# Patient Record
Sex: Female | Born: 1960 | Race: White | Hispanic: No | Marital: Married | Smoking: Never smoker
Health system: Southern US, Community
[De-identification: ages and names within clinical notes are randomized; demographics above are authoritative.]

## PROBLEM LIST (undated history)

## (undated) DIAGNOSIS — K219 Gastro-esophageal reflux disease without esophagitis: Secondary | ICD-10-CM

## (undated) DIAGNOSIS — I10 Essential (primary) hypertension: Secondary | ICD-10-CM

## (undated) DIAGNOSIS — R197 Diarrhea, unspecified: Secondary | ICD-10-CM

## (undated) DIAGNOSIS — F329 Major depressive disorder, single episode, unspecified: Secondary | ICD-10-CM

## (undated) DIAGNOSIS — F32A Depression, unspecified: Secondary | ICD-10-CM

## (undated) DIAGNOSIS — B009 Herpesviral infection, unspecified: Secondary | ICD-10-CM

## (undated) HISTORY — PX: ENDOMETRIAL ABLATION: SHX621

## (undated) HISTORY — DX: Herpesviral infection, unspecified: B00.9

## (undated) HISTORY — DX: Major depressive disorder, single episode, unspecified: F32.9

## (undated) HISTORY — PX: LAPAROSCOPIC GASTRIC BANDING: SHX1100

## (undated) HISTORY — DX: Essential (primary) hypertension: I10

## (undated) HISTORY — DX: Gastro-esophageal reflux disease without esophagitis: K21.9

## (undated) HISTORY — PX: TONSILLECTOMY: SUR1361

## (undated) HISTORY — DX: Diarrhea, unspecified: R19.7

## (undated) HISTORY — DX: Depression, unspecified: F32.A

---

## 2009-02-07 HISTORY — PX: CHOLECYSTECTOMY: SHX55

## 2014-01-06 ENCOUNTER — Telehealth: Payer: Self-pay | Admitting: Family Medicine

## 2014-01-08 NOTE — Telephone Encounter (Signed)
Patient advised we have no available appointments at this time.

## 2014-11-18 ENCOUNTER — Encounter (INDEPENDENT_AMBULATORY_CARE_PROVIDER_SITE_OTHER): Payer: Self-pay | Admitting: *Deleted

## 2014-12-17 ENCOUNTER — Ambulatory Visit (INDEPENDENT_AMBULATORY_CARE_PROVIDER_SITE_OTHER): Admitting: Internal Medicine

## 2014-12-17 ENCOUNTER — Encounter (INDEPENDENT_AMBULATORY_CARE_PROVIDER_SITE_OTHER): Payer: Self-pay | Admitting: *Deleted

## 2014-12-17 ENCOUNTER — Encounter (INDEPENDENT_AMBULATORY_CARE_PROVIDER_SITE_OTHER): Payer: Self-pay | Admitting: Internal Medicine

## 2014-12-17 ENCOUNTER — Other Ambulatory Visit (INDEPENDENT_AMBULATORY_CARE_PROVIDER_SITE_OTHER): Payer: Self-pay | Admitting: Internal Medicine

## 2014-12-17 VITALS — BP 128/92 | HR 64 | Temp 98.5°F | Ht 67.0 in | Wt 190.7 lb

## 2014-12-17 DIAGNOSIS — I10 Essential (primary) hypertension: Secondary | ICD-10-CM | POA: Insufficient documentation

## 2014-12-17 DIAGNOSIS — K219 Gastro-esophageal reflux disease without esophagitis: Secondary | ICD-10-CM

## 2014-12-17 DIAGNOSIS — F329 Major depressive disorder, single episode, unspecified: Secondary | ICD-10-CM | POA: Insufficient documentation

## 2014-12-17 DIAGNOSIS — F32A Depression, unspecified: Secondary | ICD-10-CM | POA: Insufficient documentation

## 2014-12-17 MED ORDER — PANTOPRAZOLE SODIUM 40 MG PO TBEC: 40.0000 mg | DELAYED_RELEASE_TABLET | Freq: Two times a day (BID) | ORAL | Status: DC
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
## 2014-12-17 NOTE — Patient Instructions (Signed)
EGD. The risks and benefits such as perforation, bleeding, and infection were reviewed with the patient and is agreeable. 

## 2014-12-25 ENCOUNTER — Encounter (INDEPENDENT_AMBULATORY_CARE_PROVIDER_SITE_OTHER): Payer: Self-pay | Admitting: Optometry

## 2015-01-15 ENCOUNTER — Ambulatory Visit (HOSPITAL_COMMUNITY)
Admission: RE | Admit: 2015-01-15 | Discharge: 2015-01-15 | Disposition: A | Discharge status: Home / Self Care | Source: Ambulatory Visit | Attending: Internal Medicine | Admitting: Internal Medicine

## 2015-01-15 ENCOUNTER — Encounter (HOSPITAL_COMMUNITY)
Admission: RE | Disposition: A | Discharge status: Home / Self Care | Payer: Self-pay | Source: Ambulatory Visit | Attending: Internal Medicine

## 2015-01-15 ENCOUNTER — Encounter (HOSPITAL_COMMUNITY): Payer: Self-pay | Admitting: *Deleted

## 2015-01-15 DIAGNOSIS — K219 Gastro-esophageal reflux disease without esophagitis: Secondary | ICD-10-CM

## 2015-01-15 DIAGNOSIS — I10 Essential (primary) hypertension: Secondary | ICD-10-CM | POA: Diagnosis not present

## 2015-01-15 DIAGNOSIS — F329 Major depressive disorder, single episode, unspecified: Secondary | ICD-10-CM | POA: Insufficient documentation

## 2015-01-15 DIAGNOSIS — K3189 Other diseases of stomach and duodenum: Secondary | ICD-10-CM | POA: Diagnosis not present

## 2015-01-15 DIAGNOSIS — K9509 Other complications of gastric band procedure: Secondary | ICD-10-CM | POA: Diagnosis not present

## 2015-01-15 DIAGNOSIS — K210 Gastro-esophageal reflux disease with esophagitis: Secondary | ICD-10-CM | POA: Diagnosis not present

## 2015-01-15 DIAGNOSIS — Z9884 Bariatric surgery status: Secondary | ICD-10-CM | POA: Diagnosis not present

## 2015-01-15 DIAGNOSIS — Z79899 Other long term (current) drug therapy: Secondary | ICD-10-CM | POA: Diagnosis not present

## 2015-01-15 HISTORY — PX: ESOPHAGOGASTRODUODENOSCOPY: SHX5428

## 2015-01-15 SURGERY — EGD (ESOPHAGOGASTRODUODENOSCOPY)
Anesthesia: Moderate Sedation

## 2015-01-15 MED ORDER — MIDAZOLAM HCL 5 MG/5ML IJ SOLN
INTRAMUSCULAR | Status: DC | PRN
  Administered 2015-01-15: 2 mg via INTRAVENOUS
  Administered 2015-01-15: 1 mg via INTRAVENOUS
  Administered 2015-01-15: 2 mg via INTRAVENOUS

## 2015-01-15 MED ORDER — BUTAMBEN-TETRACAINE-BENZOCAINE 2-2-14 % EX AERO
INHALATION_SPRAY | CUTANEOUS | Status: DC | PRN
  Administered 2015-01-15: 1 via TOPICAL

## 2015-01-15 MED ORDER — MEPERIDINE HCL 50 MG/ML IJ SOLN
INTRAMUSCULAR | Status: DC | PRN
  Administered 2015-01-15 (×2): 25 mg via INTRAVENOUS

## 2015-01-15 MED ORDER — STERILE WATER FOR IRRIGATION IR SOLN
Status: DC | PRN
  Administered 2015-01-15: 14:00:00

## 2015-01-15 MED ORDER — SODIUM CHLORIDE 0.9 % IV SOLN
INTRAVENOUS | Status: DC
  Administered 2015-01-15: 1000 mL via INTRAVENOUS

## 2015-01-15 MED ORDER — MEPERIDINE HCL 50 MG/ML IJ SOLN
INTRAMUSCULAR | Status: AC
  Filled 2015-01-15: qty 1

## 2015-01-15 MED ORDER — MIDAZOLAM HCL 5 MG/5ML IJ SOLN
INTRAMUSCULAR | Status: AC
  Filled 2015-01-15: qty 10

## 2015-01-15 NOTE — Discharge Instructions (Signed)
Resume usual medications. Soft diet; 6 small meals daily. No driving for 24 hours. Consultation with Dr. Ovidio Kinavid [facility] of [facility] in [place]. His office will call you. Physician will call with biopsy results.  Esophagogastroduodenoscopy, Care After Refer to this sheet in the next few weeks. These instructions provide you with information about caring for yourself after your procedure. Your health care provider may also give you more specific instructions. Your treatment has been planned according to current medical practices, but problems sometimes occur. Call your health care provider if you have any problems or questions after your procedure. WHAT TO EXPECT AFTER THE PROCEDURE After your procedure, it is typical to feel:  Soreness in your throat.  Pain with swallowing.  Sick to your stomach (nauseous).  Bloated.  Dizzy.  Fatigued. HOME CARE INSTRUCTIONS  Do not eat or drink anything until the numbing medicine (local anesthetic) has worn off and your gag reflex has returned. You will know that the local anesthetic has worn off when you can swallow comfortably.  Do not drive or operate machinery for 24 hours.  Take medicines only as directed by your health care provider. SEEK MEDICAL CARE IF:   You cannot stop coughing.  You are not urinating at all or less than usual. SEEK IMMEDIATE MEDICAL CARE IF:  You have difficulty swallowing.  You cannot eat or drink.  You have worsening throat or chest pain.  You have dizziness or lightheadedness or you faint.  You have nausea or vomiting.  You have chills.  You have a fever.  You have severe abdominal pain.  You have black, tarry, or bloody stools.   This information is not intended to replace advice given to you by your health care provider. Make sure you discuss any questions you have with your health care provider.   Document Released: 01/11/2012 Document Revised: 02/14/2014 Document Reviewed:  01/11/2012 Elsevier Interactive Patient Education [employer]2016 Elsevier Inc. .
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
## 2015-01-15 NOTE — Op Note (Signed)
EGD PROCEDURE REPORT  PATIENT:  Olivia SimonKathy Prather  MR#:  [id number] Birthdate:  Jan 20, 1961, 54 y.o., female Endoscopist:  Dr. Malissa HippoNajeeb U. Myshawn Chiriboga, MD Referred By:  Ms. Judd GaudierKeavie C.  Loman ChromanHairfield, FNP  Procedure Date: 01/15/2015  Procedure:   EGD  Indications:  Patient is 54 year old Caucasian female who presents with symptoms of refractory GERD. She is status post gastric banding 8 years ago without recent follow-up.           Informed Consent:  The risks, benefits, alternatives & imponderables which include, but are not limited to, bleeding, infection, perforation, drug reaction and potential missed lesion have been reviewed.  The potential for biopsy, lesion removal, esophageal dilation, etc. have also been discussed.  Questions have been answered.  All parties agreeable.  Please see history & physical in medical record for more information.  Medications:  Demerol 50 mg IV Versed 5 mg IV Cetacaine spray topically for oropharyngeal anesthesia  Description of procedure:  The endoscope was introduced through the mouth and advanced to the second portion of the duodenum without difficulty or limitations. The mucosal surfaces were surveyed very carefully during advancement of the scope and upon withdrawal.  Findings:  Esophagus:  Mucosa of the proximal and middle third was normal. Two erosions noted at distal esophagus proximal to GE junction. GE junction was serrated or wavy and wide open. GEJ:  35 cm Stomach:  Small proximal gastric pouch containing food debris. Luminal narrowing noted at the level of gastric banding. Scope passed across this area without too much difficulty. The gastric mucosa distal to the band was normal. Pyloric channel was patent. Angularis was unremarkable. Duodenum:  Normal bulbar and post bulbar mucosa.  Therapeutic/Diagnostic Maneuvers Performed:   Multiple biopsies taken from serrated GE junction for routine histology.  Complications:  None  EBL:  minimal  Impression: Erosive reflux esophagitis. Serrated or wavy GE junction. Biopsy taken to rule out short segment Barrett's. Food debris in proximal gastric pouch with narrowing at the level of gastric band resulting in functional obstruction. Normal examination of rest of the stomach first and second part of duodenum.  Recommendations:  Standard instructions given. Portable KUB for lap band location. Patient advised to stay on soft foods and eat 6 small meals daily. Consultation with [facility]id [facility] regarding lap band complication.  Makyia Erxleben U  01/15/2015  2:53 PM  CC: Dr. Orvilla CornwallHairfield, Keavie C & Dr. Bonnetta BarryNo ref. provider found CC: Dr. Ovidio Kinavid Newman, MD

## 2015-01-19 ENCOUNTER — Encounter (HOSPITAL_COMMUNITY): Payer: Self-pay | Admitting: Internal Medicine

## 2017-09-26 ENCOUNTER — Ambulatory Visit (INDEPENDENT_AMBULATORY_CARE_PROVIDER_SITE_OTHER): Admitting: Internal Medicine

## 2017-09-28 ENCOUNTER — Encounter (INDEPENDENT_AMBULATORY_CARE_PROVIDER_SITE_OTHER): Payer: Self-pay | Admitting: Internal Medicine

## 2017-09-28 ENCOUNTER — Ambulatory Visit (INDEPENDENT_AMBULATORY_CARE_PROVIDER_SITE_OTHER): Admitting: Internal Medicine

## 2017-09-28 VITALS — BP 140/80 | HR 68 | Temp 98.3°F | Ht 67.0 in | Wt 197.0 lb

## 2017-09-28 DIAGNOSIS — R197 Diarrhea, unspecified: Secondary | ICD-10-CM | POA: Diagnosis not present

## 2017-09-28 MED ORDER — DICYCLOMINE HCL 10 MG PO CAPS
10.0000 mg | ORAL_CAPSULE | Freq: Three times a day (TID) | ORAL | 0 refills | Status: DC
Start: 2017-09-28 — End: 2017-12-22
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
## 2017-09-28 NOTE — Patient Instructions (Addendum)
GI pathogen Rx for Dicyclomine.TID.  OV in 6 weeks

## 2017-10-11 LAB — GASTROINTESTINAL PATHOGEN PANEL PCR
C. DIFFICILE TOX A/B, PCR: NOT DETECTED
Campylobacter, PCR: NOT DETECTED
Cryptosporidium, PCR: NOT DETECTED
E COLI (ETEC) LT/ST, PCR: NOT DETECTED
E COLI (STEC) STX1/STX2, PCR: NOT DETECTED
E coli 0157, PCR: NOT DETECTED
Giardia lamblia, PCR: NOT DETECTED
Norovirus, PCR: NOT DETECTED
ROTAVIRUS, PCR: NOT DETECTED
SALMONELLA, PCR: NOT DETECTED
Shigella, PCR: NOT DETECTED

## 2017-11-09 ENCOUNTER — Encounter (INDEPENDENT_AMBULATORY_CARE_PROVIDER_SITE_OTHER): Payer: Self-pay | Admitting: Internal Medicine

## 2017-11-09 ENCOUNTER — Ambulatory Visit (INDEPENDENT_AMBULATORY_CARE_PROVIDER_SITE_OTHER): Admitting: Internal Medicine

## 2017-11-09 DIAGNOSIS — R197 Diarrhea, unspecified: Secondary | ICD-10-CM | POA: Diagnosis not present

## 2017-11-09 HISTORY — DX: Diarrhea, unspecified: R19.7

## 2017-11-09 NOTE — Patient Instructions (Signed)
OV in 1 year.  
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
## 2017-12-22 ENCOUNTER — Other Ambulatory Visit (INDEPENDENT_AMBULATORY_CARE_PROVIDER_SITE_OTHER): Payer: Self-pay | Admitting: Internal Medicine

## 2017-12-22 DIAGNOSIS — R197 Diarrhea, unspecified: Secondary | ICD-10-CM
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
## 2018-11-12 ENCOUNTER — Other Ambulatory Visit: Payer: Self-pay

## 2018-11-12 ENCOUNTER — Encounter (INDEPENDENT_AMBULATORY_CARE_PROVIDER_SITE_OTHER): Payer: Self-pay | Admitting: *Deleted

## 2018-11-12 ENCOUNTER — Ambulatory Visit (INDEPENDENT_AMBULATORY_CARE_PROVIDER_SITE_OTHER): Admitting: Nurse Practitioner

## 2018-11-12 ENCOUNTER — Telehealth (INDEPENDENT_AMBULATORY_CARE_PROVIDER_SITE_OTHER): Payer: Self-pay | Admitting: *Deleted

## 2018-11-12 ENCOUNTER — Encounter (INDEPENDENT_AMBULATORY_CARE_PROVIDER_SITE_OTHER): Payer: Self-pay | Admitting: Nurse Practitioner

## 2018-11-12 VITALS — BP 161/89 | HR 65 | Temp 98.1°F | Ht 67.0 in | Wt 195.2 lb

## 2018-11-12 DIAGNOSIS — K625 Hemorrhage of anus and rectum: Secondary | ICD-10-CM

## 2018-11-12 DIAGNOSIS — Z8379 Family history of other diseases of the digestive system: Secondary | ICD-10-CM | POA: Diagnosis not present

## 2018-11-12 DIAGNOSIS — R197 Diarrhea, unspecified: Secondary | ICD-10-CM

## 2018-11-12 MED ORDER — PEG 3350-KCL-NA BICARB-NACL 420 G PO SOLR: 4000.0000 mL | Freq: Once | ORAL | 0 refills | Status: AC

## 2018-11-12 MED ORDER — DICYCLOMINE HCL 10 MG PO CAPS: 10.0000 mg | ORAL_CAPSULE | Freq: Three times a day (TID) | ORAL | 1 refills | Status: DC | PRN

## 2018-11-12 NOTE — Telephone Encounter (Signed)
Patient needs trilyte TCS sch'd 11/11

## 2018-11-12 NOTE — Patient Instructions (Signed)
1. Complete the ordered blood tests today  2. Schedule a colonoscopy   3. Follow up with your primary doctor regarding your elevated blood pressure  4. Dicyclomine 10mg  one tab every 8 hours as needed for IBS symptoms

## 2018-11-13 ENCOUNTER — Other Ambulatory Visit (INDEPENDENT_AMBULATORY_CARE_PROVIDER_SITE_OTHER): Payer: Self-pay | Admitting: *Deleted

## 2018-11-16 LAB — CBC WITH DIFFERENTIAL/PLATELET
Absolute Monocytes: 490 cells/uL (ref 200–950)
Basophils Absolute: 28 cells/uL (ref 0–200)
Basophils Relative: 0.4 %
Eosinophils Absolute: 173 cells/uL (ref 15–500)
Eosinophils Relative: 2.5 %
HCT: 37.1 % (ref 35.0–45.0)
Hemoglobin: 12.6 g/dL (ref 11.7–15.5)
Lymphs Abs: 1656 cells/uL (ref 850–3900)
MCH: 29.7 pg (ref 27.0–33.0)
MCHC: 34 g/dL (ref 32.0–36.0)
MCV: 87.5 fL (ref 80.0–100.0)
MPV: 9.6 fL (ref 7.5–12.5)
Monocytes Relative: 7.1 %
Neutro Abs: 4554 cells/uL (ref 1500–7800)
Neutrophils Relative %: 66 %
Platelets: 205 10*3/uL (ref 140–400)
RBC: 4.24 10*6/uL (ref 3.80–5.10)
RDW: 13 % (ref 11.0–15.0)
Total Lymphocyte: 24 %
WBC: 6.9 10*3/uL (ref 3.8–10.8)

## 2018-11-16 LAB — COMPLETE METABOLIC PANEL WITH GFR
AG Ratio: 1.7 (calc) (ref 1.0–2.5)
ALT: 19 U/L (ref 6–29)
AST: 18 U/L (ref 10–35)
Albumin: 4.4 g/dL (ref 3.6–5.1)
Alkaline phosphatase (APISO): 66 U/L (ref 37–153)
BUN: 10 mg/dL (ref 7–25)
CO2: 30 mmol/L (ref 20–32)
Calcium: 9.4 mg/dL (ref 8.6–10.4)
Chloride: 102 mmol/L (ref 98–110)
Creat: 0.66 mg/dL (ref 0.50–1.05)
GFR, Est African American: 114 mL/min/{1.73_m2} (ref >=60)
GFR, Est Non African American: 99 mL/min/{1.73_m2} (ref >=60)
Globulin: 2.6 g/dL (calc) (ref 1.9–3.7)
Glucose, Bld: 90 mg/dL (ref 65–139)
Potassium: 4.2 mmol/L (ref 3.5–5.3)
Sodium: 140 mmol/L (ref 135–146)
Total Bilirubin: 0.5 mg/dL (ref 0.2–1.2)
Total Protein: 7 g/dL (ref 6.1–8.1)

## 2018-11-16 LAB — CELIAC DISEASE PANEL
(tTG) Ab, IgA: 1 U/mL
(tTG) Ab, IgG: 5 U/mL
Gliadin IgA: 3 Units
Gliadin IgG: 2 Units
Immunoglobulin A: 160 mg/dL (ref 47–310)

## 2018-11-16 LAB — C-REACTIVE PROTEIN: CRP: 7.7 mg/L (ref <8.0)

## 2018-11-16 LAB — TSH: TSH: 1.92 mIU/L (ref 0.40–4.50)

## 2018-12-11 ENCOUNTER — Other Ambulatory Visit (INDEPENDENT_AMBULATORY_CARE_PROVIDER_SITE_OTHER): Payer: Self-pay | Admitting: *Deleted

## 2018-12-17 ENCOUNTER — Other Ambulatory Visit: Payer: Self-pay

## 2018-12-17 ENCOUNTER — Other Ambulatory Visit (HOSPITAL_COMMUNITY)
Admission: RE | Admit: 2018-12-17 | Discharge: 2018-12-17 | Disposition: A | Discharge status: Home / Self Care | Source: Ambulatory Visit | Attending: Internal Medicine | Admitting: Internal Medicine

## 2018-12-17 DIAGNOSIS — Z01812 Encounter for preprocedural laboratory examination: Secondary | ICD-10-CM | POA: Insufficient documentation

## 2018-12-17 DIAGNOSIS — Z20828 Contact with and (suspected) exposure to other viral communicable diseases: Secondary | ICD-10-CM | POA: Diagnosis not present

## 2018-12-17 LAB — SARS CORONAVIRUS 2 (TAT 6-24 HRS): SARS Coronavirus 2: NEGATIVE

## 2018-12-19 ENCOUNTER — Ambulatory Visit (HOSPITAL_COMMUNITY)
Admission: RE | Admit: 2018-12-19 | Discharge: 2018-12-19 | Disposition: A | Discharge status: Home / Self Care | Attending: Internal Medicine | Admitting: Internal Medicine

## 2018-12-19 ENCOUNTER — Encounter (HOSPITAL_COMMUNITY): Payer: Self-pay | Admitting: *Deleted

## 2018-12-19 ENCOUNTER — Other Ambulatory Visit: Payer: Self-pay

## 2018-12-19 ENCOUNTER — Encounter (HOSPITAL_COMMUNITY)
Admission: RE | Disposition: A | Discharge status: Home / Self Care | Payer: Self-pay | Source: Home / Self Care | Attending: Internal Medicine

## 2018-12-19 DIAGNOSIS — Z79899 Other long term (current) drug therapy: Secondary | ICD-10-CM | POA: Diagnosis not present

## 2018-12-19 DIAGNOSIS — K529 Noninfective gastroenteritis and colitis, unspecified: Secondary | ICD-10-CM | POA: Diagnosis present

## 2018-12-19 DIAGNOSIS — F329 Major depressive disorder, single episode, unspecified: Secondary | ICD-10-CM | POA: Insufficient documentation

## 2018-12-19 DIAGNOSIS — K52832 Lymphocytic colitis: Secondary | ICD-10-CM | POA: Insufficient documentation

## 2018-12-19 DIAGNOSIS — K625 Hemorrhage of anus and rectum: Secondary | ICD-10-CM

## 2018-12-19 DIAGNOSIS — B009 Herpesviral infection, unspecified: Secondary | ICD-10-CM | POA: Insufficient documentation

## 2018-12-19 DIAGNOSIS — K6289 Other specified diseases of anus and rectum: Secondary | ICD-10-CM | POA: Diagnosis not present

## 2018-12-19 DIAGNOSIS — K219 Gastro-esophageal reflux disease without esophagitis: Secondary | ICD-10-CM | POA: Insufficient documentation

## 2018-12-19 DIAGNOSIS — K648 Other hemorrhoids: Secondary | ICD-10-CM | POA: Insufficient documentation

## 2018-12-19 DIAGNOSIS — I10 Essential (primary) hypertension: Secondary | ICD-10-CM | POA: Diagnosis not present

## 2018-12-19 DIAGNOSIS — Z881 Allergy status to other antibiotic agents status: Secondary | ICD-10-CM | POA: Insufficient documentation

## 2018-12-19 DIAGNOSIS — R197 Diarrhea, unspecified: Secondary | ICD-10-CM

## 2018-12-19 DIAGNOSIS — K644 Residual hemorrhoidal skin tags: Secondary | ICD-10-CM | POA: Insufficient documentation

## 2018-12-19 HISTORY — PX: COLONOSCOPY: SHX5424

## 2018-12-19 HISTORY — PX: BIOPSY: SHX5522

## 2018-12-19 SURGERY — COLONOSCOPY
Anesthesia: Moderate Sedation

## 2018-12-19 MED ORDER — SODIUM CHLORIDE 0.9 % IV SOLN
INTRAVENOUS | Status: DC
  Administered 2018-12-19: 1000 mL via INTRAVENOUS

## 2018-12-19 MED ORDER — MEPERIDINE HCL 50 MG/ML IJ SOLN
INTRAMUSCULAR | Status: DC | PRN
  Administered 2018-12-19 (×2): 25 mg

## 2018-12-19 MED ORDER — MIDAZOLAM HCL 5 MG/5ML IJ SOLN
INTRAMUSCULAR | Status: DC | PRN
  Administered 2018-12-19 (×2): 2 mg via INTRAVENOUS

## 2018-12-19 MED ORDER — STERILE WATER FOR IRRIGATION IR SOLN
Status: DC | PRN
  Administered 2018-12-19: 1.5 mL

## 2018-12-19 MED ORDER — MIDAZOLAM HCL 5 MG/5ML IJ SOLN
INTRAMUSCULAR | Status: AC
  Filled 2018-12-19: qty 10

## 2018-12-19 MED ORDER — MEPERIDINE HCL 50 MG/ML IJ SOLN
INTRAMUSCULAR | Status: AC
  Filled 2018-12-19: qty 1

## 2018-12-19 NOTE — Op Note (Signed)
[facility] Patient Name: Olivia Acevedo Procedure Date: 12/19/2018 1:14 PM MRN: [id number] Date of Birth: 07/12/1960 Attending MD: Lionel December , MD CSN: [id number] Age: 58 Admit Type: Outpatient Procedure:                Colonoscopy Indications:              Chronic diarrhea Providers:                Lionel December, MD, Sterling Big, RN, Burke Keels, Technician Referring MD:             Judd Gaudier Hairfield, NP Medicines:                Meperidine 50 mg IV, Midazolam 4 mg IV Complications:            No immediate complications. Estimated Blood Loss:     Estimated blood loss was minimal. Procedure:                Pre-Anesthesia Assessment:                           - Prior to the procedure, a History and Physical                            was performed, and patient medications and                            allergies were reviewed. The patient's tolerance of                            previous anesthesia was also reviewed. The risks                            and benefits of the procedure and the sedation                            options and risks were discussed with the patient.                            All questions were answered, and informed consent                            was obtained. Prior Anticoagulants: The patient has                            taken no previous anticoagulant or antiplatelet                            agents. ASA Grade Assessment: II - A patient with                            mild systemic disease. After reviewing the risks  and benefits, the patient was deemed in                            satisfactory condition to undergo the procedure.                           After obtaining informed consent, the colonoscope                            was passed under direct vision. Throughout the                            procedure, the patient's blood pressure, pulse, and          oxygen saturations were monitored continuously. The                            [id number]-[id number] ([id number]) scope was introduced through                            the anus and advanced to the the terminal ileum,                            with identification of the appendiceal orifice and                            IC valve. The colonoscopy was performed without                            difficulty. The patient tolerated the procedure                            well. The quality of the bowel preparation was                            good. The terminal ileum, ileocecal valve,                            appendiceal orifice, and rectum were photographed. Scope In: 1:51:50 PM Scope Out: 2:12:56 PM Scope Withdrawal Time: 0 hours 14 minutes 23 seconds  Total Procedure Duration: 0 hours 21 minutes 6 seconds  Findings:      Skin tags were found on perianal exam.      The terminal ileum appeared normal.      The colon (entire examined portion) appeared normal. Biopsies for       histology were taken with a cold forceps from the ascending colon and       sigmoid colon for evaluation of microscopic colitis. The pathology       specimen was placed into Bottle Number 1.      Internal hemorrhoids were found during retroflexion. The hemorrhoids       were small.      Anal papilla(e) were hypertrophied. Impression:               - Perianal skin tags found on perianal exam.                           -  The examined portion of the ileum was normal.                           - The entire examined colon is normal. Biopsied.                           - Internal hemorrhoids.                           - Anal papilla(e) were hypertrophied. Moderate Sedation:      Moderate (conscious) sedation was administered by the endoscopy nurse       and supervised by the endoscopist. The following parameters were       monitored: oxygen saturation, heart rate, blood pressure, CO2       capnography and response to care.  Total physician intraservice time was       26 minutes. Recommendation:           - Patient has a contact number available for                            emergencies. The signs and symptoms of potential                            delayed complications were discussed with the                            patient. Return to normal activities tomorrow.                            Written discharge instructions were provided to the                            patient.                           - Resume previous diet today.                           - Continue present medications.                           - No aspirin, ibuprofen, naproxen, or other                            non-steroidal anti-inflammatory drugs for 1 day.                           - Await pathology results.                           - Repeat colonoscopy in 10 years for screening                            purposes. Procedure Code(s):        --- Professional ---  84166, Colonoscopy, flexible; with biopsy, single                            or multiple                           99153, Moderate sedation; each additional 15                            minutes intraservice time                           G0500, Moderate sedation services provided by the                            same physician or other qualified health care                            professional performing a gastrointestinal                            endoscopic service that sedation supports,                            requiring the presence of an independent trained                            observer to assist in the monitoring of the                            patient's level of consciousness and physiological                            status; initial 15 minutes of intra-service time;                            patient age 35 years or older (additional time may                            be reported with [phone], as appropriate) Diagnosis  Code(s):        --- Professional ---                           K64.8, Other hemorrhoids                           K62.89, Other specified diseases of anus and rectum                           K64.4, Residual hemorrhoidal skin tags                           K52.9, Noninfective gastroenteritis and colitis,                            unspecified CPT copyright  2019 American Medical Association. All rights reserved. The codes documented in this report are preliminary and upon coder review may  be revised to meet current compliance requirements. Lionel DecemberNajeeb Rehman, MD Lionel DecemberNajeeb Rehman, MD 12/19/2018 2:20:01 PM This report has been signed electronically. Number of Addenda: 0

## 2018-12-19 NOTE — H&P (Signed)
Olivia Acevedo is an 58 y.o. female.   Chief Complaint: Patient is here for colonoscopy. HPI: Patient is 58 year old Caucasian female who presents with several month history of diarrhea which has gradually gotten worse.  She has 5-6 stools on most days.  On most days she can many more.  Stool is usually watery.  She does notice blood at times she feels is due to hemorrhoids or maybe when she goes to many times.  It usually is small amount of blood.  She denies nausea vomiting fever or chills.  She states she has lost 13 pounds this year.  She feels as both voluntary and involuntary. Family history significant celiac disease in her sister and niece.  Patient was screened for celiac antibody panel and was negative.  Patient reports doing much better with 10 mg of dicyclomine daily. Patient's last colonoscopy was 10 years ago and was normal.  This exam was performed when she was living in [place].  Past Medical History:  Diagnosis Date  . Depression   . Diarrhea 11/09/2017  . GERD (gastroesophageal reflux disease)   . Herpes simplex   . Hypertension     Past Surgical History:  Procedure Laterality Date  . CHOLECYSTECTOMY  2011  . ENDOMETRIAL ABLATION     2000  . ESOPHAGOGASTRODUODENOSCOPY N/A 01/15/2015   Procedure: ESOPHAGOGASTRODUODENOSCOPY (EGD);  Surgeon: Malissa Hippo, MD;  Location: [facility];  Service: Endoscopy;  Laterality: N/A;  2:10  . LAPAROSCOPIC GASTRIC BANDING     2008  . TONSILLECTOMY     1966    History reviewed. No pertinent family history. Social History:  reports that she has never smoked. She has never used smokeless tobacco. She reports current alcohol use. She reports that she does not use drugs.  Allergies:  Allergies  Allergen Reactions  . Shellfish Allergy     Hives and swelling  . Vancomycin     On fire. rash    Medications Prior to Admission  Medication Sig Dispense Refill  . dicyclomine (BENTYL) 10 MG capsule Take 1 capsule (10 mg total) by  mouth 3 (three) times daily as needed for spasms. 90 capsule 1  . hydrochlorothiazide (HYDRODIURIL) 25 MG tablet Take 25 mg by mouth daily.    Marland Kitchen lisinopril (PRINIVIL,ZESTRIL) 40 MG tablet Take 40 mg by mouth daily.    . sertraline (ZOLOFT) 100 MG tablet Take 100 mg by mouth daily.    Marland Kitchen amLODipine (NORVASC) 5 MG tablet Take 5 mg by mouth daily.    Marland Kitchen esomeprazole (NEXIUM) 20 MG capsule Take 20 mg by mouth as needed (Heart burn).     . valACYclovir (VALTREX) 500 MG tablet Take 500 mg by mouth daily as needed (Cold sore).       No results found for this or any previous visit (from the past 48 hour(s)). No results found.  ROS  Blood pressure 128/72, pulse 66, temperature 97.7 F (36.5 C), temperature source Oral, resp. rate 11, height 5\' 7"  (1.702 m), weight 85.7 kg, SpO2 100 %. Physical Exam  Constitutional: She appears well-developed and well-nourished.  HENT:  Mouth/Throat: Oropharynx is clear and moist.  Eyes: Conjunctivae are normal. No scleral icterus.  Neck: No thyromegaly present.  Cardiovascular: Normal rate, regular rhythm and normal heart sounds.  No murmur heard. Respiratory: Effort normal and breath sounds normal.  GI: Soft. She exhibits no distension and no mass. There is no abdominal tenderness.  Musculoskeletal:        General: No edema.  Lymphadenopathy:    She has no cervical adenopathy.  Neurological: She is alert.  Skin: Skin is warm and dry.     Assessment/Plan Chronic diarrhea. Diagnostic colonoscopy.  Hildred Laser, MD 12/19/2018, 1:41 PM

## 2018-12-19 NOTE — Discharge Instructions (Signed)
No aspirin or NSAIDs for 24 hours. Resume usual medications and diet as before. No driving for 24 hours. Physician will call with biopsy results.   Colonoscopy, Adult, Care After This sheet gives you information about how to care for yourself after your procedure. Your health care provider may also give you more specific instructions. If you have problems or questions, contact your health care provider.  Dr. Laural Golden:  [phone] What can I expect after the procedure? After the procedure, it is common to have:  A small amount of blood in your stool for 24 hours after the procedure.  Some gas.  Mild abdominal cramping or bloating. Follow these instructions at home: General instructions  For the first 24 hours after the procedure: ? Do not drive or use machinery. ? Do not sign important documents. ? Do not drink alcohol. ? Do your regular daily activities at a slower pace than normal. ? Eat soft, easy-to-digest foods.  Take over-the-counter or prescription medicines only as told by your health care provider. Relieving cramping and bloating   Try walking around when you have cramps or feel bloated.  Eating and drinking   Drink enough fluid to keep your urine pale yellow.  Avoid drinking alcohol for as long as instructed by your health care provider. Contact a health care provider if:  You have blood in your stool 2-3 days after the procedure. Get help right away if:  You have more than a small spotting of blood in your stool.  You pass large blood clots in your stool.  Your abdomen is swollen.  You have nausea or vomiting.  You have a fever.  You have increasing abdominal pain that is not relieved with medicine. Summary  After the procedure, it is common to have a small amount of blood in your stool. You may also have mild abdominal cramping and bloating.  For the first 24 hours after the procedure, do not drive or use machinery, sign important documents, or  drink alcohol.  Contact your health care provider if you have a lot of blood in your stool, nausea or vomiting, a fever, or increased abdominal pain. This information is not intended to replace advice given to you by your health care provider. Make sure you discuss any questions you have with your health care provider. Document Released: 09/08/2003 Document Revised: 11/16/2016 Document Reviewed: 04/07/2015 Elsevier Patient Education  2020 [employer].

## 2018-12-21 LAB — SURGICAL PATHOLOGY

## 2018-12-24 ENCOUNTER — Encounter (HOSPITAL_COMMUNITY): Payer: Self-pay | Admitting: Internal Medicine

## 2019-06-25 ENCOUNTER — Ambulatory Visit (INDEPENDENT_AMBULATORY_CARE_PROVIDER_SITE_OTHER): Admitting: Internal Medicine

## 2020-12-09 ENCOUNTER — Other Ambulatory Visit: Payer: Self-pay | Admitting: Neurosurgery

## 2020-12-09 DIAGNOSIS — S22018A Other fracture of first thoracic vertebra, initial encounter for closed fracture: Secondary | ICD-10-CM

## 2020-12-10 ENCOUNTER — Other Ambulatory Visit: Payer: Self-pay

## 2020-12-10 ENCOUNTER — Ambulatory Visit
Admission: RE | Admit: 2020-12-10 | Discharge: 2020-12-10 | Disposition: A | Discharge status: Home / Self Care | Source: Ambulatory Visit | Attending: Neurosurgery | Admitting: Neurosurgery

## 2020-12-10 DIAGNOSIS — S22018A Other fracture of first thoracic vertebra, initial encounter for closed fracture: Secondary | ICD-10-CM

## 2020-12-28 ENCOUNTER — Encounter (HOSPITAL_BASED_OUTPATIENT_CLINIC_OR_DEPARTMENT_OTHER): Payer: Self-pay

## 2020-12-28 ENCOUNTER — Emergency Department (HOSPITAL_BASED_OUTPATIENT_CLINIC_OR_DEPARTMENT_OTHER)

## 2020-12-28 ENCOUNTER — Emergency Department (HOSPITAL_BASED_OUTPATIENT_CLINIC_OR_DEPARTMENT_OTHER)
Admission: EM | Admit: 2020-12-28 | Discharge: 2020-12-28 | Disposition: A | Discharge status: Home / Self Care | Attending: Emergency Medicine | Admitting: Emergency Medicine

## 2020-12-28 ENCOUNTER — Other Ambulatory Visit: Payer: Self-pay

## 2020-12-28 DIAGNOSIS — I10 Essential (primary) hypertension: Secondary | ICD-10-CM | POA: Diagnosis not present

## 2020-12-28 DIAGNOSIS — M7632 Iliotibial band syndrome, left leg: Secondary | ICD-10-CM | POA: Insufficient documentation

## 2020-12-28 DIAGNOSIS — Z79899 Other long term (current) drug therapy: Secondary | ICD-10-CM | POA: Diagnosis not present

## 2020-12-28 DIAGNOSIS — M25562 Pain in left knee: Secondary | ICD-10-CM | POA: Diagnosis present

## 2020-12-28 MED ORDER — KETOROLAC TROMETHAMINE 60 MG/2ML IM SOLN
30.0000 mg | Freq: Once | INTRAMUSCULAR | Status: AC
  Administered 2020-12-28: 30 mg via INTRAMUSCULAR
  Filled 2020-12-28: qty 2

## 2020-12-28 MED ORDER — MELOXICAM 7.5 MG PO TABS: 7.5000 mg | ORAL_TABLET | Freq: Every day | ORAL | 0 refills | Status: AC

## 2020-12-28 NOTE — ED Provider Notes (Signed)
[facility] DEPT Provider Note   CSN: [id number] Arrival date & time: 12/28/20  [id number]     History Chief Complaint  Patient presents with   Knee Pain    left    Olivia Acevedo is a 60 y.o. female.   Knee Pain Associated symptoms: no back pain, no fatigue, no fever and no neck pain   Patient presents for 2 days of lateral left knee pain.  She has not had any associated swelling.  She has not had any recent injuries.  Pain does radiate up the lateral left side of her proximal left leg.  Pain is worsened with palpation and ambulation.  She denies any systemic symptoms.  She has not taken anything at home for analgesia.    Past Medical History:  Diagnosis Date   Depression    Diarrhea 11/09/2017   GERD (gastroesophageal reflux disease)    Herpes simplex    Hypertension     Patient Active Problem List   Diagnosis Date Noted   Rectal bleeding 11/12/2018   Family history of celiac disease 11/12/2018   Diarrhea 11/09/2017   GERD (gastroesophageal reflux disease) 12/17/2014   Depression 12/17/2014   Essential hypertension 12/17/2014    Past Surgical History:  Procedure Laterality Date   BIOPSY  12/19/2018   Procedure: BIOPSY;  Surgeon: Rogene Houston, MD;  Location: [facility];  Service: Endoscopy;;   CHOLECYSTECTOMY  2011   COLONOSCOPY N/A 12/19/2018   Procedure: COLONOSCOPY;  Surgeon: Rogene Houston, MD;  Location: [facility];  Service: Endoscopy;  Laterality: N/A;  225pm   ENDOMETRIAL ABLATION     2000   ESOPHAGOGASTRODUODENOSCOPY N/A 01/15/2015   Procedure: ESOPHAGOGASTRODUODENOSCOPY (EGD);  Surgeon: Rogene Houston, MD;  Location: [facility];  Service: Endoscopy;  Laterality: N/A;  2:10   LAPAROSCOPIC GASTRIC BANDING     2008   TONSILLECTOMY     1966     OB History   No obstetric history on file.     No family history on file.  Social History   Tobacco Use   Smoking status: Never   Smokeless tobacco: Never  Vaping  Use   Vaping Use: Never used  Substance Use Topics   Alcohol use: Yes    Alcohol/week: 0.0 standard drinks    Comment: occasionally   Drug use: No    Home Medications Prior to Admission medications   Medication Sig Start Date End Date Taking? Authorizing Provider  amLODipine (NORVASC) 5 MG tablet Take 5 mg by mouth daily.   Yes [provider]  dicyclomine (BENTYL) 10 MG capsule Take 1 capsule (10 mg total) by mouth 3 (three) times daily as needed for spasms. 11/12/18  Yes Noralyn Pick, NP  hydrochlorothiazide (HYDRODIURIL) 25 MG tablet Take 25 mg by mouth daily.   Yes [provider]  lisinopril (PRINIVIL,ZESTRIL) 40 MG tablet Take 40 mg by mouth daily.   Yes [provider]  meloxicam (MOBIC) 7.5 MG tablet Take 1 tablet (7.5 mg total) by mouth daily for 10 days. 12/28/20 01/07/21 Yes Godfrey Pick, MD  sertraline (ZOLOFT) 100 MG tablet Take 100 mg by mouth daily.   Yes [provider]  esomeprazole (NEXIUM) 20 MG capsule Take 20 mg by mouth as needed (Heart burn).  Patient not taking: Reported on 12/28/2020    [provider]  valACYclovir (VALTREX) 500 MG tablet Take 500 mg by mouth daily as needed (Cold sore).     [provider]  Allergies    Shellfish allergy and Vancomycin  Review of Systems   Review of Systems  Constitutional:  Negative for chills, fatigue and fever.  HENT:  Negative for ear pain and sore throat.   Eyes:  Negative for pain and visual disturbance.  Respiratory:  Negative for cough and shortness of breath.   Cardiovascular:  Negative for chest pain and palpitations.  Gastrointestinal:  Negative for abdominal pain and vomiting.  Genitourinary:  Negative for dysuria and hematuria.  Musculoskeletal:  Positive for arthralgias. Negative for back pain, joint swelling, myalgias and neck pain.  Skin:  Negative for color change and rash.  Neurological:  Negative for dizziness, seizures, syncope,  weakness and numbness.  All other systems reviewed and are negative.  Physical Exam Updated Vital Signs BP (!) 138/107 (BP Location: Right Arm)   Pulse 73   Temp 98.6 F (37 C) (Oral)   Resp 15   Ht 5\' 7"  (1.702 m)   Wt 83.9 kg   SpO2 100%   BMI 28.98 kg/m   Physical Exam Vitals and nursing note reviewed.  Constitutional:      General: She is not in acute distress.    Appearance: Normal appearance. She is well-developed and normal weight. She is not ill-appearing, toxic-appearing or diaphoretic.  HENT:     Head: Normocephalic and atraumatic.     Right Ear: External ear normal.     Left Ear: External ear normal.     Nose: Nose normal.  Eyes:     Conjunctiva/sclera: Conjunctivae normal.  Cardiovascular:     Rate and Rhythm: Normal rate and regular rhythm.     Heart sounds: No murmur heard. Pulmonary:     Effort: Pulmonary effort is normal. No respiratory distress.     Breath sounds: Normal breath sounds.  Abdominal:     Palpations: Abdomen is soft.     Tenderness: There is no abdominal tenderness.  Musculoskeletal:        General: Tenderness (Distal ITB) present. No swelling, deformity or signs of injury.     Cervical back: Normal range of motion and neck supple.  Skin:    General: Skin is warm and dry.     Capillary Refill: Capillary refill takes less than 2 seconds.     Coloration: Skin is not jaundiced or pale.  Neurological:     General: No focal deficit present.     Mental Status: She is alert and oriented to person, place, and time.     Cranial Nerves: No cranial nerve deficit.     Sensory: No sensory deficit.     Motor: No weakness.  Psychiatric:        Mood and Affect: Mood normal.        Behavior: Behavior normal.        Thought Content: Thought content normal.        Judgment: Judgment normal.    ED Results / Procedures / Treatments   Labs (all labs ordered are listed, but only abnormal results are displayed) Labs Reviewed - No data to  display  EKG None  Radiology DG Knee 2 Views Left  Result Date: 12/28/2020 CLINICAL DATA:  Left knee pain EXAM: LEFT KNEE - 2 VIEW COMPARISON:  None. FINDINGS: No evidence of fracture, dislocation, or joint effusion. No evidence of arthropathy or other focal bone abnormality. Soft tissues are unremarkable. IMPRESSION: Negative. Electronically Signed   By: 12/30/2020 M.D.   On: 12/28/2020 09:23    Procedures Procedures  Medications Ordered in ED Medications  ketorolac (TORADOL) injection 30 mg (30 mg Intramuscular Given 12/28/20 [id number])    ED Course  I have reviewed the triage vital signs and the nursing notes.  Pertinent labs & imaging results that were available during my care of the patient were reviewed by me and considered in my medical decision making (see chart for details).    MDM Rules/Calculators/A&P                          Healthy 60 year old female presenting for 2 days of lateral left knee pain that radiates up the distal ITB.  She denies any other symptoms.  She has not had any recent trauma.  X-ray imaging negative for acute osseous or joint abnormalities.  Toradol given for analgesia.  Patient was provided with a knee brace, to be worn for comfort.  Prescription for Mobic was given.  Patient to follow-up with Ortho/sports medicine as needed if she does experience persistent symptoms.  Discharged in good condition.  Final Clinical Impression(s) / ED Diagnoses Final diagnoses:  Iliotibial band tendinitis of left side    Rx / DC Orders ED Discharge Orders          Ordered    meloxicam (MOBIC) 7.5 MG tablet  Daily        12/28/20 0930             Godfrey Pick, MD 12/29/20 [phone]

## 2020-12-28 NOTE — ED Triage Notes (Signed)
Pt c/o left lateral/posterior knee pain starting Saturday night.  Denies any injury, trauma or fall.

## 2020-12-28 NOTE — Discharge Instructions (Addendum)
There is a prescription for meloxicam.  This is a nonsteroidal anti-inflammatory pain medication.  Take daily.  Continue movement and ambulation, within the tolerance of your pain.  If you do experience persistent symptoms, please follow-up with a sports medicine doctor for further evaluation and testing.  There is a number below to call as needed.

## 2021-03-24 ENCOUNTER — Ambulatory Visit (INDEPENDENT_AMBULATORY_CARE_PROVIDER_SITE_OTHER): Admitting: Orthopaedic Surgery

## 2021-03-24 ENCOUNTER — Encounter: Payer: Self-pay | Admitting: Orthopaedic Surgery

## 2021-03-24 DIAGNOSIS — G8929 Other chronic pain: Secondary | ICD-10-CM | POA: Diagnosis not present

## 2021-03-24 DIAGNOSIS — M25561 Pain in right knee: Secondary | ICD-10-CM | POA: Diagnosis not present

## 2021-03-24 NOTE — Progress Notes (Signed)
Subjective:    Patient ID: Olivia Acevedo, female    DOB: 08-06-1960, 61 y.o.   MRN: [id number]  HPI She feel about three to four weeks ago and hurt her right knee. She has had swelling and popping and pain.  It is getting worse. She has lateral pain and now has developed giving way of the knee several times a day.  She falls.  She has seen her primary care doctor and I have reviewed the notes.  She saw him two weeks ago and also yesterday.  A knee injection was given two weeks ago. She has no new trauma.  She has tried ice, Tylenol with no help.   Review of Systems  Constitutional:  Positive for activity change.  Musculoskeletal:  Positive for arthralgias, gait problem and joint swelling.  All other systems reviewed and are negative. For Review of Systems, all other systems reviewed and are negative.  The following is a summary of the past history medically, past history surgically, known current medicines, social history and family history.  This information is gathered electronically by the computer from prior information and documentation.  I review this each visit and have found including this information at this point in the chart is beneficial and informative.   Past Medical History:  Diagnosis Date   Depression    Diarrhea 11/09/2017   GERD (gastroesophageal reflux disease)    Herpes simplex    Hypertension     Past Surgical History:  Procedure Laterality Date   BIOPSY  12/19/2018   Procedure: BIOPSY;  Surgeon: Malissa Hippo, MD;  Location: [facility];  Service: Endoscopy;;   CHOLECYSTECTOMY  2011   COLONOSCOPY N/A 12/19/2018   Procedure: COLONOSCOPY;  Surgeon: Malissa Hippo, MD;  Location: [facility];  Service: Endoscopy;  Laterality: N/A;  225pm   ENDOMETRIAL ABLATION     2000   ESOPHAGOGASTRODUODENOSCOPY N/A 01/15/2015   Procedure: ESOPHAGOGASTRODUODENOSCOPY (EGD);  Surgeon: Malissa Hippo, MD;  Location: [facility];  Service: Endoscopy;  Laterality:  N/A;  2:10   LAPAROSCOPIC GASTRIC BANDING     2008   TONSILLECTOMY     1966    Current Outpatient Medications on File Prior to Visit  Medication Sig Dispense Refill   amLODipine (NORVASC) 5 MG tablet Take 5 mg by mouth daily.     carvedilol (COREG) 12.5 MG tablet Take by mouth.     hydrochlorothiazide (HYDRODIURIL) 25 MG tablet Take 25 mg by mouth daily.     sertraline (ZOLOFT) 100 MG tablet Take by mouth.     dicyclomine (BENTYL) 10 MG capsule Take 1 capsule (10 mg total) by mouth 3 (three) times daily as needed for spasms. (Patient not taking: Reported on 03/24/2021) 90 capsule 1   esomeprazole (NEXIUM) 20 MG capsule Take 20 mg by mouth as needed (Heart burn).  (Patient not taking: Reported on 12/28/2020)     lisinopril (PRINIVIL,ZESTRIL) 40 MG tablet Take 40 mg by mouth daily. (Patient not taking: Reported on 03/24/2021)     sertraline (ZOLOFT) 100 MG tablet Take 100 mg by mouth daily. (Patient not taking: Reported on 03/24/2021)     valACYclovir (VALTREX) 500 MG tablet Take 500 mg by mouth daily as needed (Cold sore).  (Patient not taking: Reported on 03/24/2021)     No current facility-administered medications on file prior to visit.    Social History   Socioeconomic History   Marital status: Married    Spouse name: Not on file  Number of children: Not on file   Years of education: Not on file   Highest education level: Not on file  Occupational History   Not on file  Tobacco Use   Smoking status: Never   Smokeless tobacco: Never  Vaping Use   Vaping Use: Never used  Substance and Sexual Activity   Alcohol use: Yes    Alcohol/week: 0.0 standard drinks    Comment: occasionally   Drug use: No   Sexual activity: Not on file  Other Topics Concern   Not on file  Social History Narrative   Not on file   Social Determinants of Health   Financial Resource Strain: Not on file  Food Insecurity: Not on file  Transportation Needs: Not on file  Physical Activity: Not on file   Stress: Not on file  Social Connections: Not on file  Intimate Partner Violence: Not on file    No family history on file.  There were no vitals taken for this visit.  There is no height or weight on file to calculate BMI.     Objective:   Physical Exam Vitals and nursing note reviewed. Exam conducted with a chaperone present.  Constitutional:      Appearance: She is well-developed.  HENT:     Head: Normocephalic and atraumatic.  Eyes:     Conjunctiva/sclera: Conjunctivae normal.     Pupils: Pupils are equal, round, and reactive to light.  Cardiovascular:     Rate and Rhythm: Normal rate and regular rhythm.  Pulmonary:     Effort: Pulmonary effort is normal.  Abdominal:     Palpations: Abdomen is soft.  Musculoskeletal:     Cervical back: Normal range of motion and neck supple.       Legs:  Skin:    General: Skin is warm and dry.  Neurological:     Mental Status: She is alert and oriented to person, place, and time.     Cranial Nerves: No cranial nerve deficit.     Motor: No abnormal muscle tone.     Coordination: Coordination normal.     Deep Tendon Reflexes: Reflexes are normal and symmetric. Reflexes normal.  Psychiatric:        Behavior: Behavior normal.        Thought Content: Thought content normal.        Judgment: Judgment normal.  X-rays were done of the right knee reported separately.  Right knee has slight effusion, good alignment.  No fracture or loose body noted.  Bone quality is good.          Assessment & Plan:   Encounter Diagnosis  Name Primary?   Chronic pain of right knee Yes   I feel she has lateral meniscus tear and lateral collateral ligament strain.  I would like to get MRI if her insurance allows it.  She has giving way and positive McMurray.  Return in two weeks.  I can see earlier if MRI is done earlier.  Call if any problem.  Precautions discussed.  Electronically Signed Darreld Mclean, MD 2/15/20239:41 AM

## 2021-03-24 NOTE — Addendum Note (Signed)
Addended by: Cherre Huger E on: 03/24/2021 09:51 AM   Modules accepted: Orders

## 2021-04-07 ENCOUNTER — Ambulatory Visit (HOSPITAL_COMMUNITY)
Admission: RE | Admit: 2021-04-07 | Discharge: 2021-04-07 | Disposition: A | Discharge status: Home / Self Care | Source: Ambulatory Visit | Attending: Orthopaedic Surgery | Admitting: Orthopaedic Surgery

## 2021-04-07 ENCOUNTER — Other Ambulatory Visit: Payer: Self-pay

## 2021-04-07 DIAGNOSIS — M25561 Pain in right knee: Secondary | ICD-10-CM | POA: Diagnosis not present

## 2021-04-07 DIAGNOSIS — G8929 Other chronic pain: Secondary | ICD-10-CM | POA: Diagnosis present

## 2021-04-13 ENCOUNTER — Ambulatory Visit (INDEPENDENT_AMBULATORY_CARE_PROVIDER_SITE_OTHER): Admitting: Orthopaedic Surgery

## 2021-04-13 ENCOUNTER — Encounter: Payer: Self-pay | Admitting: Orthopaedic Surgery

## 2021-04-13 ENCOUNTER — Other Ambulatory Visit: Payer: Self-pay

## 2021-04-13 VITALS — Ht 67.0 in | Wt 183.6 lb

## 2021-04-13 DIAGNOSIS — M23321 Other meniscus derangements, posterior horn of medial meniscus, right knee: Secondary | ICD-10-CM

## 2021-04-13 NOTE — Progress Notes (Signed)
My knee hurts some ? ?She has had better days with the right knee but when it bothers her it gives way and is very painful medially.  She has no new trauma. ? ?MRI of the right knee showed: ?IMPRESSION: ?1. Large radial tear of the root of the posterior horn of the medial ?meniscus. ?2. Radial tear of the body and anterior horn-body junction of the ?lateral meniscus. ?3. Mild partial-thickness cartilage loss of the medial femorotibial ?compartment. ?4. Partial thickness cartilage loss of the lateral femorotibial ?compartment. ?5. Small ruptured Baker's cyst. ? ?I have explained the findings to her.  I will have her see Dr. Aline Brochure for possible arthroscopy.  She is agreeable. ? ?I have independently reviewed the MRI.   ? ?Right knee has medial pain, crepitus, slight effusion, positive medial McMurray, ROM 0 to 110, no distal edema, NV intact. ? ?Encounter Diagnosis  ?Name Primary?  ? 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 ?I will have her see Dr. Aline Brochure for possible arthroscopy. ? ?Call if any problem. ? ?Precautions discussed. ? ?Electronically Signed ?Sanjuana Kava, MD ?3/7/20238:36 AM ? ?

## 2021-04-21 ENCOUNTER — Ambulatory Visit: Admitting: Orthopaedic Surgery

## 2021-04-28 ENCOUNTER — Ambulatory Visit (INDEPENDENT_AMBULATORY_CARE_PROVIDER_SITE_OTHER): Admitting: Orthopedic Surgery

## 2021-04-28 ENCOUNTER — Other Ambulatory Visit: Payer: Self-pay

## 2021-04-28 ENCOUNTER — Encounter: Payer: Self-pay | Admitting: Orthopedic Surgery

## 2021-04-28 VITALS — BP 141/78 | HR 64 | Ht 67.0 in | Wt 181.0 lb

## 2021-04-28 DIAGNOSIS — M23300 Other meniscus derangements, unspecified lateral meniscus, right knee: Secondary | ICD-10-CM

## 2021-04-28 DIAGNOSIS — Z01818 Encounter for other preprocedural examination: Secondary | ICD-10-CM

## 2021-04-28 DIAGNOSIS — S83241A Other tear of medial meniscus, current injury, right knee, initial encounter: Secondary | ICD-10-CM | POA: Diagnosis not present

## 2021-04-28 DIAGNOSIS — G8929 Other chronic pain: Secondary | ICD-10-CM

## 2021-04-28 DIAGNOSIS — M25561 Pain in right knee: Secondary | ICD-10-CM | POA: Diagnosis not present

## 2021-04-28 NOTE — Patient Instructions (Signed)
Meniscus Injury, Arthroscopy   Arthroscopy is a surgical procedure that involves the use of a small scope that has a camera and surgical instruments on the end (arthroscope). An arthroscope can be used to repair your meniscus injury.  LET YOUR HEALTH CARE PROVIDER KNOW ABOUT:  Any allergies you have.  All medicines you are taking, including vitamins, herbs, eyedrops, creams, and over-the-counter medicines.  Any recent colds or infections you have had or currently have.  Previous problems you or members of your family have had with the use of anesthetics.  Any blood disorders or blood clotting problems you have.  Previous surgeries you have had.  Medical conditions you have. RISKS AND COMPLICATIONS Generally, this is a safe procedure. However, as with any procedure, problems can occur. Possible problems include:  Damage to nerves or blood vessels.  Excess bleeding.  Blood clots.  Infection. BEFORE THE PROCEDURE  Do not eat or drink for 6-8 hours before the procedure.  Take medicines as directed by your surgeon. Ask your surgeon about changing or stopping your regular medicines.  You may have lab tests the morning of surgery. PROCEDURE  You will be given one of the following:   A medicine that numbs the area (local anesthesia).  A medicine that makes you go to sleep (general anesthesia).  A medicine injected into your spine that numbs your body below the waist (spinal anesthesia). Most often, several small cuts (incisions) are made in the knee. The arthroscope and instruments go into the incisions to repair the damage. The torn portion of the meniscus is removed.   AFTER THE PROCEDURE  You will be taken to the recovery area where your progress will be monitored. When you are awake, stable, and taking fluids without complications, you will be allowed to go home. This is usually the same day. A torn or stretched ligament (ligament sprain) may take 6-8 weeks to heal.   It  takes about the 4-6 WEEKS if your surgeon removed a torn meniscus.  A repaired meniscus may require 6-12 weeks of recovery time.  A torn ligament needing reconstructive surgery may take 6-12 months to heal fully.   This information is not intended to replace advice given to you by your health care provider. Make sure you discuss any questions you have with your health care provider. You have decided to proceed with operative arthroscopy of the knee. You have decided not to continue with nonoperative measures such as but not limited to oral medication, weight loss, activity modification, physical therapy, bracing, or injection.  We will perform operative arthroscopy of the knee. Some of the risks associated with arthroscopic surgery of the knee include but are not limited to Bleeding Infection Swelling Stiffness Blood clot Pain Need for knee replacement surgery    In compliance with recent [place] law in federal regulation regarding opioid use and abuse and addiction, we will taper (stop) opioid medication after 2 weeks.  If you're not comfortable with these risks and would like to continue with nonoperative treatment please let Dr. Kaiyana Bedore know prior to your surgery. 

## 2021-04-29 NOTE — Progress Notes (Signed)
?Preop evaluation for Ms. Olivia Acevedo patient of Dr. Keeling is referred to me for possible surgery ?  ?The patient was noted to have fallen in late January injuring her right knee she is having swelling pain and popping which was worsening she developed some giving way symptoms and eventually had an MRI of the knee which showed she had torn meniscal tissue ? ?MRI indicates large radial tear root posterior horn medial meniscus, radial tear of the body and anterior horn junction of lateral meniscus with partial-thickness cartilage loss medial femoral tibial compartment and partial-thickness cartilage loss lateral compartment small Baker's cyst rupture ? ?She is considering knee arthroscopy ? ?Review of systems no new findings no anesthetic problems or bleeding issues ? ?  ?    ?Past Medical History:  ?Diagnosis Date  ? Depression    ? Diarrhea 11/09/2017  ? GERD (gastroesophageal reflux disease)    ? Herpes simplex    ? Hypertension    ?  ?     ?Past Surgical History:  ?Procedure Laterality Date  ? BIOPSY   12/19/2018  ?  Procedure: BIOPSY;  Surgeon: Rehman, Najeeb U, MD;  [facility]: [facility];  Service: Endoscopy;;  ? CHOLECYSTECTOMY   2011  ? COLONOSCOPY N/A 12/19/2018  ?  Procedure: COLONOSCOPY;  Surgeon: Rehman, Najeeb U, MD;  [facility];  Service: Endoscopy;  Laterality: N/A;  225pm  ? ENDOMETRIAL ABLATION      ?  2000  ? ESOPHAGOGASTRODUODENOSCOPY N/A 01/15/2015  ?  Procedure: ESOPHAGOGASTRODUODENOSCOPY (EGD);  Surgeon: Najeeb U Rehman, MD;  [facility]: [facility];  Service: Endoscopy;  Laterality: N/A;  2:10  ? LAPAROSCOPIC GASTRIC BANDING      ?  2008  ? TONSILLECTOMY      ?  1966  ?  ?No family history on file. ?Social History  ?  ?     ?Tobacco Use  ? Smoking status: Never  ? Smokeless tobacco: Never  ?Vaping Use  ? Vaping Use: Never used  ?Substance Use Topics  ? Alcohol use: Yes  ?    Alcohol/week: 0.0 standard drinks  ?    Comment: occasionally  ? Drug use: No  ?  ?  ?BP (!) 141/78   Pulse  64   Ht 5' 7" (1.702 m)   Wt 181 lb (82.1 kg)   BMI 28.35 kg/m?  ?  ?General appearance is normal ? ?Cardiovascular exam no varicosities normal pulses ? ?No lymphadenopathy ? ?Gait is normal ? ?Neuropsychiatric exam shows normal coordination in the lower extremities good to tendon reflexes and sensation she is alert and oriented x3 there is no depression ? ?Her skin over the right knee is normal.  She is tender over the medial joint line she has a pretty good functional range of motion with no extension deficits and flexion of 225 degrees knee is stable strength is normal McMurray's sign is positive ? 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 ?MRI ? ?Large tear of the posterior horn medial meniscus and a radial tear of the lateral meniscus with partial-thickness cartilage loss medial femoral-tibial compartment and mild partial cartilage loss lateral tibiofemoral compartment ? ?We discussed this at length arthroscopy versus total knee ?  ?The procedure has been fully reviewed with the patient; The risks and benefits of surgery have been discussed and explained and understood. Alternative treatment has also been reviewed, questions were encouraged and answered. The postoperative plan is also been reviewed. ?  ?She has chosen 

## 2021-05-06 ENCOUNTER — Other Ambulatory Visit: Payer: Self-pay

## 2021-05-06 DIAGNOSIS — S83241A Other tear of medial meniscus, current injury, right knee, initial encounter: Secondary | ICD-10-CM

## 2021-05-19 NOTE — Patient Instructions (Signed)
? ? ? ? ? ? ? ? Angelo Caroll ? 05/19/2021  ?  ? @PREFPERIOPPHARMACY @ ? ? Your procedure is scheduled on  05/25/2021. ? ? Report to 05/27/2021 at  1100  A.M. ? ? Call this number if you have problems the morning of surgery: ? [phone] ? ? Remember: ? Do not eat or drink after midnight. ?  ?  ? Take these medicines the morning of surgery with A SIP OF WATER  ? ?amlodipine, nexium, zoloft. ?  ? Do not wear jewelry, make-up or nail polish. ? Do not wear lotions, powders, or perfumes, or deodorant. ? Do not shave 48 hours prior to surgery.  Men may shave face and neck. ? Do not bring valuables to the hospital. ? [facility] is not responsible for any belongings or valuables. ? ?Contacts, dentures or bridgework may not be worn into surgery.  Leave your suitcase in the car.  After surgery it may be brought to your room. ? ?For patients admitted to the hospital, discharge time will be determined by your treatment team. ? ?Patients discharged the day of surgery will not be allowed to drive home and must have someone with them for 24 hours.  ? ? ?Special instructions:   DO NOT smoke tobacco or vape for 24 hours before your procedure. ? ?Please read over the following fact sheets that you were given. ?Coughing and Deep Breathing, Surgical Site Infection Prevention, Anesthesia Post-op Instructions, and Care and Recovery After Surgery ?  ? ? ? Arthroscopic Knee Ligament Repair, Care After ?This sheet gives you information about how to care for yourself after your procedure. Your health care provider may also give you more specific instructions. If you have problems or questions, contact your health care provider. ?What can I expect after the procedure? ?After the procedure, it is common to have: ?Soreness or pain in your knee. ?Bruising and swelling on your knee, calf, and ankle for 3-4 days. ?A small amount of fluid coming from the incisions. ?Follow these instructions at home: ?Medicines ?Take over-the-counter and  prescription medicines only as told by your health care provider. ?Ask your health care provider if the medicine prescribed to you: ?Requires you to avoid driving or using machinery. ?Can cause constipation. You may need to take these actions to prevent or treat constipation: ?Drink enough fluid to keep your urine pale yellow. ?Take over-the-counter or prescription medicines. ?Eat foods that are high in fiber, such as beans, whole grains, and fresh fruits and vegetables. ?Limit foods that are high in fat and processed sugars, such as fried or sweet foods. ?If you have a brace or immobilizer: ?Wear it as told by your health care provider. Remove it only as told by your health care provider. ?Loosen it if your toes tingle, become numb, or turn cold and blue. ?Keep it clean and dry. ?Ask your health care provider when it is safe to drive. ?Bathing ?Do not take baths, swim, or use a hot tub until your health care provider approves. ?Keep your bandage (dressing) dry until your health care provider says that it can be removed. ?If the brace or immobilizer is not waterproof: ?Do not let it get wet. ?Cover it with a watertight covering when you take a bath or shower. ?Incision care ? ?Follow instructions from your health care provider about how to take care of your incisions. Make sure you: ?Wash your hands with soap and water for at least 20 seconds before and after you change your  dressing. If soap and water are not available, use hand sanitizer. ?Change your dressing as told by your health care provider. ?Leave stitches (sutures), skin glue, or adhesive strips in place. These skin closures may need to stay in place for 2 weeks or longer. If adhesive strip edges start to loosen and curl up, you may trim the loose edges. Do not remove adhesive strips completely unless your health care provider tells you to do that. ?Check your incision areas every day for signs of infection. Check for: ?Redness. ?More swelling or  pain. ?Blood or more fluid. ?Warmth. ?Pus or a bad smell. ?Managing pain, stiffness, and swelling ? ?If directed, put ice on the affected area. To do this: ?If you have a removable brace or immobilizer, remove it as told by your health care provider. ?Put ice in a plastic bag. ?Place a towel between your skin and the bag. ?Leave the ice on for 20 minutes, 2-3 times a day. ?Remove the ice if your skin turns bright red. This is very important. If you cannot feel pain, heat, or cold, you have a greater risk of damage to the area. ?Move your toes often to reduce stiffness and swelling. ?Raise (elevate) the injured area above the level of your heart while you are sitting or lying down. ?Activity ?Do not use your knee to support your body weight until your health care provider says that you can. Use crutches or other devices as told by your health care provider. ?Do physical therapy exercises as told by your health care provider. Physical therapy will help you regain movement and strength in your knee. ?Follow instructions from your health care provider about: ?When you may start motion exercises. ?When you may start riding a stationary bike and doing other low-impact activities. ?When you may start to jog and do other high-impact activities. ?Do not lift anything that is heavier than 10 lb (4.5 kg), or the limit that you are told, until your health care provider says that it is safe. ?Ask your health care provider what activities are safe for you. ?General instructions ?Do not use any products that contain nicotine or tobacco, such as cigarettes, e-cigarettes, and chewing tobacco. These can delay healing. If you need help quitting, ask your health care provider. ?Wear compression stockings as told by your health care provider. These stockings help to prevent blood clots and reduce swelling in your legs. ?Keep all follow-up visits. This is important. ?Contact a health care provider if: ?You have any of these signs of  infection: ?Redness around an incision. ?Blood or more fluid coming from an incision. ?Warmth coming from an incision. ?Pus or a bad smell coming from an incision. ?More swelling or pain in your knee. ?A fever or chills. ?You have pain that does not get better with medicine. ?Your incision opens up. ?Get help right away if: ?You have trouble breathing. ?You have chest pain. ?You have increased pain or swelling in your calf or at the back of your knee. ?You have numbness and tingling near the knee joint or in the foot, ankle, or toes. ?You notice that your foot or toes look darker than normal or are cooler than normal. ?These symptoms may represent a serious problem that is an emergency. Do not wait to see if the symptoms will go away. Get medical help right away. Call your local emergency services (911 in the [place] Do not drive yourself to the hospital. ?Summary ?After the procedure, it is common to have knee pain  with bruising and swelling on your knee, calf, and ankle. ?Icing your knee and raising your leg above the level of your heart will help control the pain and swelling. ?Do physical therapy exercises as told by your health care provider. Physical therapy will help you regain movement and strength in your knee. ?This information is not intended to replace advice given to you by your health care provider. Make sure you discuss any questions you have with your health care provider. ?Document Revised: 06/24/2019 Document Reviewed: 06/24/2019 ?Elsevier Patient Education ? 2022 [employer] ?General Anesthesia, Adult, Care After ?This sheet gives you information about how to care for yourself after your procedure. Your health care provider may also give you more specific instructions. If you have problems or questions, contact your health care provider. ?What can I expect after the procedure? ?After the procedure, the following side effects are common: ?Pain or discomfort at the IV  site. ?Nausea. ?Vomiting. ?Sore throat. ?Trouble concentrating. ?Feeling cold or chills. ?Feeling weak or tired. ?Sleepiness and fatigue. ?Soreness and body aches. These side effects can affect parts of the body that were not involved in surgery. ?F

## 2021-05-21 ENCOUNTER — Encounter (HOSPITAL_COMMUNITY): Payer: Self-pay

## 2021-05-21 ENCOUNTER — Encounter (HOSPITAL_COMMUNITY)

## 2021-05-21 ENCOUNTER — Encounter (HOSPITAL_COMMUNITY)
Admission: RE | Admit: 2021-05-21 | Discharge: 2021-05-21 | Disposition: A | Discharge status: Home / Self Care | Source: Ambulatory Visit | Attending: Orthopedic Surgery | Admitting: Orthopedic Surgery

## 2021-05-21 VITALS — BP 116/64 | HR 69 | Temp 97.9°F | Resp 16 | Ht 67.0 in | Wt 180.0 lb

## 2021-05-21 DIAGNOSIS — Z01812 Encounter for preprocedural laboratory examination: Secondary | ICD-10-CM | POA: Insufficient documentation

## 2021-05-21 DIAGNOSIS — Z01818 Encounter for other preprocedural examination: Secondary | ICD-10-CM

## 2021-05-21 LAB — CBC WITH DIFFERENTIAL/PLATELET
Abs Immature Granulocytes: 0.03 10*3/uL (ref 0.00–0.07)
Basophils Absolute: 0 10*3/uL (ref 0.0–0.1)
Basophils Relative: 0 %
Eosinophils Absolute: 0.1 10*3/uL (ref 0.0–0.5)
Eosinophils Relative: 1 %
HCT: 37.8 % (ref 36.0–46.0)
Hemoglobin: 12.7 g/dL (ref 12.0–15.0)
Immature Granulocytes: 0 %
Lymphocytes Relative: 21 %
Lymphs Abs: 1.7 10*3/uL (ref 0.7–4.0)
MCH: 29.6 pg (ref 26.0–34.0)
MCHC: 33.6 g/dL (ref 30.0–36.0)
MCV: 88.1 fL (ref 80.0–100.0)
Monocytes Absolute: 0.6 10*3/uL (ref 0.1–1.0)
Monocytes Relative: 7 %
Neutro Abs: 5.7 10*3/uL (ref 1.7–7.7)
Neutrophils Relative %: 71 %
Platelets: 209 10*3/uL (ref 150–400)
RBC: 4.29 MIL/uL (ref 3.87–5.11)
RDW: 13 % (ref 11.5–15.5)
WBC: 8.1 10*3/uL (ref 4.0–10.5)
nRBC: 0 % (ref 0.0–0.2)

## 2021-05-21 LAB — BASIC METABOLIC PANEL
Anion gap: 7 (ref 5–15)
BUN: 14 mg/dL (ref 6–20)
CO2: 29 mmol/L (ref 22–32)
Calcium: 9 mg/dL (ref 8.9–10.3)
Chloride: 99 mmol/L (ref 98–111)
Creatinine, Ser: 0.59 mg/dL (ref 0.44–1.00)
GFR, Estimated: >60 mL/min (ref >60)
Glucose, Bld: 107 mg/dL — ABNORMAL HIGH (ref 70–99)
Potassium: 3.9 mmol/L (ref 3.5–5.1)
Sodium: 135 mmol/L (ref 135–145)

## 2021-05-21 LAB — PROTIME-INR
INR: 1 (ref 0.8–1.2)
Prothrombin Time: 12.6 seconds (ref 11.4–15.2)

## 2021-05-21 LAB — APTT: aPTT: 26 seconds (ref 24–36)

## 2021-05-21 LAB — HCG, QUANTITATIVE, PREGNANCY: hCG, Beta Chain, Quant, S: 2 m[IU]/mL (ref <5)

## 2021-05-21 MED ORDER — CEFAZOLIN SODIUM-DEXTROSE 2-4 GM/100ML-% IV SOLN: 2.0000 g | INTRAVENOUS | Status: DC

## 2021-05-24 NOTE — H&P (Signed)
?Preop evaluation for Ms. Olivia Acevedo patient of Dr. Luna Glasgow is referred to me for possible surgery ?  ?The patient was noted to have fallen in late January injuring her right knee she is having swelling pain and popping which was worsening she developed some giving way symptoms and eventually had an MRI of the knee which showed she had torn meniscal tissue ? ?MRI indicates large radial tear root posterior horn medial meniscus, radial tear of the body and anterior horn junction of lateral meniscus with partial-thickness cartilage loss medial femoral tibial compartment and partial-thickness cartilage loss lateral compartment small Baker's cyst rupture ? ?She is considering knee arthroscopy ? ?Review of systems no new findings no anesthetic problems or bleeding issues ? ?  ?    ?Past Medical History:  ?Diagnosis Date  ? Depression    ? Diarrhea 11/09/2017  ? GERD (gastroesophageal reflux disease)    ? Herpes simplex    ? Hypertension    ?  ?     ?Past Surgical History:  ?Procedure Laterality Date  ? BIOPSY   12/19/2018  ?  Procedure: BIOPSY;  Surgeon: Rogene Houston, MD;  Location: [facility];  Service: Endoscopy;;  ? CHOLECYSTECTOMY   2011  ? COLONOSCOPY N/A 12/19/2018  ?  Procedure: COLONOSCOPY;  Surgeon: Rogene Houston, MD;  Location: [facility];  Service: Endoscopy;  Laterality: N/A;  225pm  ? ENDOMETRIAL ABLATION      ?  2000  ? ESOPHAGOGASTRODUODENOSCOPY N/A 01/15/2015  ?  Procedure: ESOPHAGOGASTRODUODENOSCOPY (EGD);  Surgeon: Rogene Houston, MD;  Location: [facility];  Service: Endoscopy;  Laterality: N/A;  2:10  ? LAPAROSCOPIC GASTRIC BANDING      ?  2008  ? TONSILLECTOMY      ?  1966  ?  ?No family history on file. ?Social History  ?  ?     ?Tobacco Use  ? Smoking status: Never  ? Smokeless tobacco: Never  ?Vaping Use  ? Vaping Use: Never used  ?Substance Use Topics  ? Alcohol use: Yes  ?    Alcohol/week: 0.0 standard drinks  ?    Comment: occasionally  ? Drug use: No  ?  ?  ?BP (!) 141/78   Pulse  64   Ht 5\' 7"  (1.702 m)   Wt 181 lb (82.1 kg)   BMI 28.35 kg/m?  ?  ?General appearance is normal ? ?Cardiovascular exam no varicosities normal pulses ? ?No lymphadenopathy ? ?Gait is normal ? ?Neuropsychiatric exam shows normal coordination in the lower extremities good to tendon reflexes and sensation she is alert and oriented x3 there is no depression ? ?Her skin over the right knee is normal.  She is tender over the medial joint line she has a pretty good functional range of motion with no extension deficits and flexion of 225 degrees knee is stable strength is normal McMurray's sign is positive ? 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 ?MRI ? ?Large tear of the posterior horn medial meniscus and a radial tear of the lateral meniscus with partial-thickness cartilage loss medial femoral-tibial compartment and mild partial cartilage loss lateral tibiofemoral compartment ? ?We discussed this at length arthroscopy versus total knee ?  ?The procedure has been fully reviewed with the patient; The risks and benefits of surgery have been discussed and explained and understood. Alternative treatment has also been reviewed, questions were encouraged and answered. The postoperative plan is also been reviewed. ?  ?She has chosen  with full understanding of potential need for further surgery to do the right knee arthroscopy partial medial lateral meniscectomies  ?

## 2021-05-25 ENCOUNTER — Ambulatory Visit (HOSPITAL_BASED_OUTPATIENT_CLINIC_OR_DEPARTMENT_OTHER): Admitting: Certified Registered Nurse Anesthetist

## 2021-05-25 ENCOUNTER — Ambulatory Visit (HOSPITAL_COMMUNITY): Admitting: Certified Registered Nurse Anesthetist

## 2021-05-25 ENCOUNTER — Encounter (HOSPITAL_COMMUNITY)
Admission: RE | Disposition: A | Discharge status: Home / Self Care | Payer: Self-pay | Source: Home / Self Care | Attending: Orthopedic Surgery

## 2021-05-25 ENCOUNTER — Other Ambulatory Visit: Payer: Self-pay

## 2021-05-25 ENCOUNTER — Encounter (HOSPITAL_COMMUNITY): Payer: Self-pay | Admitting: Orthopedic Surgery

## 2021-05-25 ENCOUNTER — Ambulatory Visit (HOSPITAL_COMMUNITY)
Admission: RE | Admit: 2021-05-25 | Discharge: 2021-05-25 | Disposition: A | Discharge status: Home / Self Care | Attending: Orthopedic Surgery | Admitting: Orthopedic Surgery

## 2021-05-25 DIAGNOSIS — W19XXXA Unspecified fall, initial encounter: Secondary | ICD-10-CM | POA: Diagnosis not present

## 2021-05-25 DIAGNOSIS — M1711 Unilateral primary osteoarthritis, right knee: Secondary | ICD-10-CM

## 2021-05-25 DIAGNOSIS — S83241A Other tear of medial meniscus, current injury, right knee, initial encounter: Secondary | ICD-10-CM | POA: Diagnosis not present

## 2021-05-25 DIAGNOSIS — Z01818 Encounter for other preprocedural examination: Secondary | ICD-10-CM

## 2021-05-25 DIAGNOSIS — S83281D Other tear of lateral meniscus, current injury, right knee, subsequent encounter: Secondary | ICD-10-CM | POA: Diagnosis not present

## 2021-05-25 DIAGNOSIS — S83281A Other tear of lateral meniscus, current injury, right knee, initial encounter: Secondary | ICD-10-CM

## 2021-05-25 DIAGNOSIS — S86811A Strain of other muscle(s) and tendon(s) at lower leg level, right leg, initial encounter: Secondary | ICD-10-CM | POA: Diagnosis not present

## 2021-05-25 DIAGNOSIS — S83241D Other tear of medial meniscus, current injury, right knee, subsequent encounter: Secondary | ICD-10-CM | POA: Diagnosis not present

## 2021-05-25 DIAGNOSIS — K219 Gastro-esophageal reflux disease without esophagitis: Secondary | ICD-10-CM | POA: Diagnosis not present

## 2021-05-25 HISTORY — PX: KNEE ARTHROSCOPY WITH MEDIAL MENISECTOMY: SHX5651

## 2021-05-25 SURGERY — ARTHROSCOPY, KNEE, WITH MEDIAL MENISCECTOMY
Anesthesia: General | Site: Knee | Laterality: Right

## 2021-05-25 MED ORDER — SODIUM CHLORIDE 0.9 % IV SOLN: INTRAVENOUS | Status: DC | PRN

## 2021-05-25 MED ORDER — SODIUM CHLORIDE 0.9 % IR SOLN
Status: DC | PRN
  Administered 2021-05-25: 1000 mL

## 2021-05-25 MED ORDER — HYDROCODONE-ACETAMINOPHEN 5-325 MG PO TABS: 1.0000 | ORAL_TABLET | ORAL | 0 refills | Status: DC | PRN

## 2021-05-25 MED ORDER — DIPHENHYDRAMINE HCL 50 MG/ML IJ SOLN
INTRAMUSCULAR | Status: DC | PRN
  Administered 2021-05-25: 12.5 mg via INTRAVENOUS

## 2021-05-25 MED ORDER — PHENYLEPHRINE HCL (PRESSORS) 10 MG/ML IV SOLN
INTRAVENOUS | Status: DC | PRN
  Administered 2021-05-25: 80 ug via INTRAVENOUS
  Administered 2021-05-25: 120 ug via INTRAVENOUS

## 2021-05-25 MED ORDER — SODIUM CHLORIDE 0.9 % IR SOLN
Status: DC | PRN
  Administered 2021-05-25 (×2): 3000 mL

## 2021-05-25 MED ORDER — ONDANSETRON HCL 4 MG/2ML IJ SOLN: 4.0000 mg | Freq: Once | INTRAMUSCULAR | Status: DC | PRN

## 2021-05-25 MED ORDER — ORAL CARE MOUTH RINSE: 15.0000 mL | Freq: Once | OROMUCOSAL | Status: AC

## 2021-05-25 MED ORDER — EPINEPHRINE PF 1 MG/ML IJ SOLN
INTRAMUSCULAR | Status: AC
  Filled 2021-05-25: qty 3

## 2021-05-25 MED ORDER — SCOPOLAMINE 1 MG/3DAYS TD PT72
MEDICATED_PATCH | TRANSDERMAL | Status: AC
  Filled 2021-05-25: qty 1

## 2021-05-25 MED ORDER — FENTANYL CITRATE PF 50 MCG/ML IJ SOSY: 25.0000 ug | PREFILLED_SYRINGE | INTRAMUSCULAR | Status: DC | PRN

## 2021-05-25 MED ORDER — SCOPOLAMINE 1 MG/3DAYS TD PT72
1.0000 | MEDICATED_PATCH | Freq: Once | TRANSDERMAL | Status: DC
  Administered 2021-05-25: 1.5 mg via TRANSDERMAL

## 2021-05-25 MED ORDER — DIPHENHYDRAMINE HCL 50 MG/ML IJ SOLN
INTRAMUSCULAR | Status: AC
  Filled 2021-05-25: qty 1

## 2021-05-25 MED ORDER — PROPOFOL 10 MG/ML IV BOLUS
INTRAVENOUS | Status: DC | PRN
Start: 2021-05-25 — End: 2021-05-25
  Administered 2021-05-25: 100 mg via INTRAVENOUS

## 2021-05-25 MED ORDER — CEFAZOLIN SODIUM-DEXTROSE 2-4 GM/100ML-% IV SOLN
2.0000 g | Freq: Once | INTRAVENOUS | Status: AC
  Administered 2021-05-25: 2 g via INTRAVENOUS

## 2021-05-25 MED ORDER — PROPOFOL 10 MG/ML IV BOLUS
INTRAVENOUS | Status: AC
  Filled 2021-05-25: qty 20

## 2021-05-25 MED ORDER — LIDOCAINE HCL (PF) 2 % IJ SOLN
INTRAMUSCULAR | Status: AC
  Filled 2021-05-25: qty 5

## 2021-05-25 MED ORDER — FENTANYL CITRATE (PF) 100 MCG/2ML IJ SOLN
INTRAMUSCULAR | Status: AC
  Filled 2021-05-25: qty 2

## 2021-05-25 MED ORDER — ONDANSETRON HCL 4 MG/2ML IJ SOLN
INTRAMUSCULAR | Status: AC
  Filled 2021-05-25: qty 4

## 2021-05-25 MED ORDER — CEFAZOLIN SODIUM-DEXTROSE 2-4 GM/100ML-% IV SOLN
INTRAVENOUS | Status: AC
Start: 2021-05-25 — End: 2021-05-25
  Filled 2021-05-25: qty 100

## 2021-05-25 MED ORDER — BUPIVACAINE-EPINEPHRINE (PF) 0.5% -1:200000 IJ SOLN
INTRAMUSCULAR | Status: AC
  Filled 2021-05-25: qty 60

## 2021-05-25 MED ORDER — MIDAZOLAM HCL 2 MG/2ML IJ SOLN
INTRAMUSCULAR | Status: AC
  Filled 2021-05-25: qty 2

## 2021-05-25 MED ORDER — IBUPROFEN 800 MG PO TABS: 800.0000 mg | ORAL_TABLET | Freq: Three times a day (TID) | ORAL | 1 refills | Status: DC | PRN

## 2021-05-25 MED ORDER — DEXAMETHASONE SODIUM PHOSPHATE 10 MG/ML IJ SOLN
INTRAMUSCULAR | Status: DC | PRN
  Administered 2021-05-25: 10 mg via INTRAVENOUS

## 2021-05-25 MED ORDER — LIDOCAINE HCL (CARDIAC) PF 50 MG/5ML IV SOSY
PREFILLED_SYRINGE | INTRAVENOUS | Status: DC | PRN
  Administered 2021-05-25: 60 mg via INTRAVENOUS

## 2021-05-25 MED ORDER — FENTANYL CITRATE (PF) 100 MCG/2ML IJ SOLN
INTRAMUSCULAR | Status: DC | PRN
  Administered 2021-05-25 (×2): 50 ug via INTRAVENOUS

## 2021-05-25 MED ORDER — KETOROLAC TROMETHAMINE 30 MG/ML IJ SOLN
INTRAMUSCULAR | Status: DC | PRN
  Administered 2021-05-25: 30 mg via INTRAVENOUS

## 2021-05-25 MED ORDER — CHLORHEXIDINE GLUCONATE 0.12 % MT SOLN
15.0000 mL | Freq: Once | OROMUCOSAL | Status: AC
  Administered 2021-05-25: 15 mL via OROMUCOSAL

## 2021-05-25 MED ORDER — DEXAMETHASONE SODIUM PHOSPHATE 10 MG/ML IJ SOLN
INTRAMUSCULAR | Status: AC
  Filled 2021-05-25: qty 1

## 2021-05-25 MED ORDER — KETOROLAC TROMETHAMINE 30 MG/ML IJ SOLN
INTRAMUSCULAR | Status: AC
  Filled 2021-05-25: qty 1

## 2021-05-25 MED ORDER — CHLORHEXIDINE GLUCONATE 0.12 % MT SOLN
OROMUCOSAL | Status: AC
  Filled 2021-05-25: qty 15

## 2021-05-25 MED ORDER — BUPIVACAINE-EPINEPHRINE (PF) 0.5% -1:200000 IJ SOLN
INTRAMUSCULAR | Status: DC | PRN
  Administered 2021-05-25: 60 mL via PERINEURAL

## 2021-05-25 MED ORDER — MIDAZOLAM HCL 2 MG/2ML IJ SOLN
INTRAMUSCULAR | Status: DC | PRN
Start: 2021-05-25 — End: 2021-05-25
  Administered 2021-05-25: 2 mg via INTRAVENOUS

## 2021-05-25 MED ORDER — LACTATED RINGERS IV SOLN: INTRAVENOUS | Status: DC

## 2021-05-25 MED ORDER — PROMETHAZINE HCL 12.5 MG PO TABS: 12.5000 mg | ORAL_TABLET | Freq: Four times a day (QID) | ORAL | 0 refills | Status: AC | PRN

## 2021-05-25 MED ORDER — ONDANSETRON HCL 4 MG/2ML IJ SOLN
INTRAMUSCULAR | Status: DC | PRN
Start: 2021-05-25 — End: 2021-05-25
  Administered 2021-05-25: 4 mg via INTRAVENOUS

## 2021-05-25 SURGICAL SUPPLY — 49 items
ABLATOR ASPIRATE 50D MULTI-PRT (SURGICAL WAND) ×1 IMPLANT
APL PRP STRL LF DISP 70% ISPRP (MISCELLANEOUS) ×1
BAG HAMPER (MISCELLANEOUS) ×2 IMPLANT
BLADE SHAVER TORPEDO 4X13 (MISCELLANEOUS) ×1 IMPLANT
BLADE SURG SZ11 CARB STEEL (BLADE) ×2 IMPLANT
BNDG CMPR STD VLCR NS LF 5.8X6 (GAUZE/BANDAGES/DRESSINGS) ×1
BNDG ELASTIC 6X5.8 VLCR NS LF (GAUZE/BANDAGES/DRESSINGS) ×2 IMPLANT
CHLORAPREP W/TINT 26 (MISCELLANEOUS) ×2 IMPLANT
CLOTH BEACON ORANGE TIMEOUT ST (SAFETY) ×2 IMPLANT
COOLER ICEMAN CLASSIC (MISCELLANEOUS) ×2 IMPLANT
CUFF TOURN SGL QUICK 34 (TOURNIQUET CUFF) ×2
CUFF TRNQT CYL 34X4.125X (TOURNIQUET CUFF) IMPLANT
DRAPE HALF SHEET 40X57 (DRAPES) ×2 IMPLANT
GAUZE 4X4 16PLY ~~LOC~~+RFID DBL (SPONGE) ×2 IMPLANT
GAUZE SPONGE 4X4 12PLY STRL (GAUZE/BANDAGES/DRESSINGS) ×2 IMPLANT
GAUZE SPONGE 4X4 16PLY XRAY LF (GAUZE/BANDAGES/DRESSINGS) ×2 IMPLANT
GAUZE XEROFORM 5X9 LF (GAUZE/BANDAGES/DRESSINGS) ×2 IMPLANT
GLOVE BIOGEL PI IND STRL 7.0 (GLOVE) ×2 IMPLANT
GLOVE BIOGEL PI IND STRL 8.5 (GLOVE) IMPLANT
GLOVE BIOGEL PI INDICATOR 7.0 (GLOVE) ×4
GLOVE BIOGEL PI INDICATOR 8.5 (GLOVE) ×1
GLOVE SURG SS PI 7.0 STRL IVOR (GLOVE) ×1 IMPLANT
GLOVE SURG SS PI 8.0 STRL IVOR (GLOVE) ×1 IMPLANT
GOWN STRL REUS W/TWL LRG LVL3 (GOWN DISPOSABLE) ×2 IMPLANT
GOWN STRL REUS W/TWL XL LVL3 (GOWN DISPOSABLE) ×2 IMPLANT
IV NS IRRIG 3000ML ARTHROMATIC (IV SOLUTION) ×4 IMPLANT
KIT BLADEGUARD II DBL (SET/KITS/TRAYS/PACK) ×2 IMPLANT
KIT TURNOVER CYSTO (KITS) ×2 IMPLANT
MANIFOLD NEPTUNE II (INSTRUMENTS) ×2 IMPLANT
MARKER SKIN DUAL TIP RULER LAB (MISCELLANEOUS) ×2 IMPLANT
NDL HYPO 21X1.5 SAFETY (NEEDLE) ×1 IMPLANT
NDL SPNL 18GX3.5 QUINCKE PK (NEEDLE) ×1 IMPLANT
NEEDLE HYPO 21X1.5 SAFETY (NEEDLE) ×2 IMPLANT
NEEDLE SPNL 18GX3.5 QUINCKE PK (NEEDLE) ×2 IMPLANT
NS IRRIG 1000ML POUR BTL (IV SOLUTION) ×2 IMPLANT
PACK ARTHRO LIMB DRAPE STRL (MISCELLANEOUS) ×2 IMPLANT
PAD ABD 5X9 TENDERSORB (GAUZE/BANDAGES/DRESSINGS) ×2 IMPLANT
PAD ARMBOARD 7.5X6 YLW CONV (MISCELLANEOUS) ×2 IMPLANT
PAD COLD SHLDR SM WRAP-ON (PAD) IMPLANT
PAD COLD SHLDR WRAP-ON (PAD) ×1 IMPLANT
PAD FOR LEG HOLDER (MISCELLANEOUS) ×2 IMPLANT
PADDING CAST COTTON 6X4 STRL (CAST SUPPLIES) ×2 IMPLANT
SET ARTHROSCOPY INST (INSTRUMENTS) ×2 IMPLANT
SET BASIN LINEN APH (SET/KITS/TRAYS/PACK) ×2 IMPLANT
SUT ETHILON 3 0 FSL (SUTURE) ×2 IMPLANT
SYR 10ML LL (SYRINGE) ×2 IMPLANT
SYR 30ML LL (SYRINGE) ×2 IMPLANT
TUBE CONNECTING 12X1/4 (SUCTIONS) ×4 IMPLANT
TUBING IN/OUT FLOW W/MAIN PUMP (TUBING) ×2 IMPLANT

## 2021-05-25 NOTE — Anesthesia Postprocedure Evaluation (Signed)
Anesthesia Post Note ? ?Patient: Olivia Acevedo ? ?Procedure(s) Performed: KNEE ARTHROSCOPY WITH MEDIAL AND LATERAL MENISECTOMY (Right: Knee) ? ?Patient location during evaluation: Phase II ?Anesthesia Type: General ?Level of consciousness: awake ?Pain management: pain level controlled ?Vital Signs Assessment: post-procedure vital signs reviewed and stable ?Respiratory status: spontaneous breathing and respiratory function stable ?Cardiovascular status: blood pressure returned to baseline and stable ?Postop Assessment: no headache and no apparent nausea or vomiting ?Anesthetic complications: no ?Comments: Late entry ? ? ?No notable events documented. ? ? ?Last Vitals:  ?Vitals:  ? 05/25/21 1458 05/25/21 1538  ?BP: (!) 120/51 136/81  ?Pulse: 73 63  ?Resp: 10 16  ?Temp: 36.7 ?C 37.2 ?C  ?SpO2: 100% 99%  ?  ?Last Pain:  ?Vitals:  ? 05/25/21 1538  ?TempSrc: Oral  ?PainSc: 0-No pain  ? ? ?  ?  ?  ?  ?  ?  ? ?Windell Norfolk ? ? ? ? ?

## 2021-05-25 NOTE — Anesthesia Procedure Notes (Signed)
Procedure Name: LMA Insertion ?Date/Time: 05/25/2021 2:00 PM ?Performed by: Darryl Nestle, CRNA ?Pre-anesthesia Checklist: Patient identified, Emergency Drugs available, Suction available and Patient being monitored ?Patient Re-evaluated:Patient Re-evaluated prior to induction ?Oxygen Delivery Method: Circle system utilized ?Preoxygenation: Pre-oxygenation with 100% oxygen ?Induction Type: IV induction ?Ventilation: Mask ventilation without difficulty ?LMA: LMA inserted ?LMA Size: 4.0 ?Tube type: Oral ?Number of attempts: 1 ?Placement Confirmation: positive ETCO2 and breath sounds checked- equal and bilateral ?Tube secured with: Tape ?Dental Injury: Teeth and Oropharynx as per pre-operative assessment  ? ? ? ? ?

## 2021-05-25 NOTE — Transfer of Care (Signed)
Immediate Anesthesia Transfer of Care Note ? ?Patient: Olivia Acevedo ? ?Procedure(s) Performed: KNEE ARTHROSCOPY WITH MEDIAL AND LATERAL MENISECTOMY (Right: Knee) ? ?Patient Location: PACU ? ?Anesthesia Type:General ? ?Level of Consciousness: awake ? ?Airway & Oxygen Therapy: Patient Spontanous Breathing ? ?Post-op Assessment: Report given to RN and Post -op Vital signs reviewed and stable ? ?Post vital signs: Reviewed and stable ? ?Last Vitals:  ?Vitals Value Taken Time  ?BP 120/51   ?Temp    ?Pulse 73 05/25/21 1457  ?Resp    ?SpO2 100 % 05/25/21 1457  ?Vitals shown include unvalidated device data. ? ?Last Pain:  ?Vitals:  ? 05/25/21 1212  ?PainSc: 0-No pain  ?   ? ?  ? ?Complications: No notable events documented. ?

## 2021-05-25 NOTE — Op Note (Signed)
05/25/2021 ? ?2:57 PM ? ?PATIENT:  Olivia Acevedo  61 y.o. female ? ?PRE-OPERATIVE DIAGNOSIS:  medial and lateral meniscus tears and osteoarthritis right knee ? ?POST-OPERATIVE DIAGNOSIS:  medial and lateral meniscus tears and osteoarthritis right knee ? ?Findings: Torn medial meniscus at the root radial tear, mild chondromalacia medial femoral condyle ? ?ACL PCL intact ? ?Mild chondromalacia lateral femoral condyle ? ?Lateral meniscal tear at the body ? ?Normal trochlea normal patella ? ?Small medial synovial plica ? ?PROCEDURE:  Procedure(s) with comments: ?KNEE ARTHROSCOPY WITH MEDIAL AND LATERAL MENISECTOMY (Right) - possible lateral menisectomy ? ?SURGEON:  Surgeon(s) and Role: ?   Vickki Hearing, MD - Primary ? ?PHYSICIAN ASSISTANT:  ? ?ASSISTANTS: none  ? ?ANESTHESIA:   general ? ?EBL:  none  ? ?BLOOD ADMINISTERED:none ? ?DRAINS: none  ? ?LOCAL MEDICATIONS USED:  MARCAINE    ? ?SPECIMEN:  No Specimen ? ?DISPOSITION OF SPECIMEN:  N/A ? ?COUNTS:  YES ? ?TOURNIQUET:  * Missing tourniquet times found for documented tourniquets in log: [id number] * ? ?DICTATION: .Dragon Dictation ? ?PLAN OF CARE: Discharge to home after PACU ? ?PATIENT DISPOSITION:  PACU - hemodynamically stable. ?  ?Delay start of Pharmacological VTE agent (>24hrs) due to surgical blood loss or risk of bleeding: not applicable ? ?Dictation ? ?Ms. Chou was seen in preop her leg was evaluated she was cleared for surgery and a chart review was completed including review of the MRI.  The surgical site was confirmed and marked his right knee ? ?Patient was taken the operating for general anesthesia with LMA technique ? ?She was in the supine position ? ?We placed a tourniquet on the right thigh but never elevated.  We placed a arthroscopic leg holder on her right knee area ? ?We placed the well leg in a well leg padded holder ? ?After sterile prep and drape timeout was completed ? ?I made a standard lateral portal.  I placed the scope in the  joint and then reviewed the knee starting in the suprapatellar region working around the medial gutter medial joint notch lateral joint and lateral gutter.  No loose bodies were found ? ?Torn medial meniscus was noted posterior horn at the root.  A spinal needle was made to make a portal medially and then a probe was placed into the joint it was a radial tear which was not otherwise displaceable.  I used a straight biting forcep to trim the meniscus and then remove the meniscal fragments with a torpedo blade shaver and then cleaned up the meniscal edges.  Probe was then introduced back into the joint to confirm stability of the remaining meniscus.  Estimated meniscal resection 15% of the posterior horn ? ?I then placed the leg in line figure-of-four position and then proceeded to use the shaver to resect the torn lateral meniscus.  This tear went all the way back to the meniscosynovial junction. ? ?The anterior horn and posterior horn were otherwise stable ? ?I irrigated the joint and then decided to resect the medial synovial plica.  I irrigated again and then remove the fluid from the joint with suction through the shaver and then injected through the scope to 60 cc of Marcaine with epinephrine ? ?I closed the portals with 1 nylon suture in each portal and then applied a sterile dressing and a Cryo/Cuff ? ?The patient was extubated taken to recovery room in stable condition ? ?She will be weightbearing as tolerated and will see me in a week ? ?  [id number] ? ?

## 2021-05-25 NOTE — Brief Op Note (Signed)
05/25/2021 ? ?2:57 PM ? ?PATIENT:  Olivia Acevedo  61 y.o. female ? ?PRE-OPERATIVE DIAGNOSIS:  medial and lateral meniscus tears and osteoarthritis right knee ? ?POST-OPERATIVE DIAGNOSIS:  medial and lateral meniscus tears and osteoarthritis right knee ? ?Findings: Torn medial meniscus at the root radial tear, mild chondromalacia medial femoral condyle ? ?ACL PCL intact ? ?Mild chondromalacia lateral femoral condyle ? ?Lateral meniscal tear at the body ? ?Normal trochlea normal patella ? ?Small medial synovial plica ? ?PROCEDURE:  Procedure(s) with comments: ?KNEE ARTHROSCOPY WITH MEDIAL AND LATERAL MENISECTOMY (Right) - possible lateral menisectomy ? ?SURGEON:  Surgeon(s) and Role: ?   Carole Civil, MD - Primary ? ?PHYSICIAN ASSISTANT:  ? ?ASSISTANTS: none  ? ?ANESTHESIA:   general ? ?EBL:  none  ? ?BLOOD ADMINISTERED:none ? ?DRAINS: none  ? ?LOCAL MEDICATIONS USED:  MARCAINE    ? ?SPECIMEN:  No Specimen ? ?DISPOSITION OF SPECIMEN:  N/A ? ?COUNTS:  YES ? ?TOURNIQUET:  * Missing tourniquet times found for documented tourniquets in log: [id number] * ? ?DICTATION: .Dragon Dictation ? ?PLAN OF CARE: Discharge to home after PACU ? ?PATIENT DISPOSITION:  PACU - hemodynamically stable. ?  ?Delay start of Pharmacological VTE agent (>24hrs) due to surgical blood loss or risk of bleeding: not applicable ? ?Dictation ? ?Ms. Battley was seen in preop her leg was evaluated she was cleared for surgery and a chart review was completed including review of the MRI.  The surgical site was confirmed and marked his right knee ? ?Patient was taken the operating for general anesthesia with LMA technique ? ?She was in the supine position ? ?We placed a tourniquet on the right thigh but never elevated.  We placed a arthroscopic leg holder on her right knee area ? ?We placed the well leg in a well leg padded holder ? ?After sterile prep and drape timeout was completed ? ?I made a standard lateral portal.  I placed the scope in the  joint and then reviewed the knee starting in the suprapatellar region working around the medial gutter medial joint notch lateral joint and lateral gutter.  No loose bodies were found ? ?Torn medial meniscus was noted posterior horn at the root.  A spinal needle was made to make a portal medially and then a probe was placed into the joint it was a radial tear which was not otherwise displaceable.  I used a straight biting forcep to trim the meniscus and then remove the meniscal fragments with a torpedo blade shaver and then cleaned up the meniscal edges.  Probe was then introduced back into the joint to confirm stability of the remaining meniscus.  Estimated meniscal resection 15% of the posterior horn ? ?I then placed the leg in line figure-of-four position and then proceeded to use the shaver to resect the torn lateral meniscus.  This tear went all the way back to the meniscosynovial junction. ? ?The anterior horn and posterior horn were otherwise stable ? ?I irrigated the joint and then decided to resect the medial synovial plica.  I irrigated again and then remove the fluid from the joint with suction through the shaver and then injected through the scope to 60 cc of Marcaine with epinephrine ? ?I closed the portals with 1 nylon suture in each portal and then applied a sterile dressing and a Cryo/Cuff ? ?The patient was extubated taken to recovery room in stable condition ? ?She will be weightbearing as tolerated and will see me in a week ? ? ? ?

## 2021-05-25 NOTE — Anesthesia Preprocedure Evaluation (Signed)
Anesthesia Evaluation  ?Patient identified by MRN, date of birth, ID band ?Patient awake ? ? ? ?Reviewed: ?Allergy & Precautions, H&P , NPO status , Patient's Chart, lab work & pertinent test results, reviewed documented beta blocker date and time  ? ?Airway ?Mallampati: II ? ?TM Distance: >3 FB ?Neck ROM: full ? ? ? Dental ?no notable dental hx. ? ?  ?Pulmonary ?neg pulmonary ROS,  ?  ?Pulmonary exam normal ?breath sounds clear to auscultation ? ? ? ? ? ? Cardiovascular ?Exercise Tolerance: Good ?hypertension, negative cardio ROS ? ? ?Rhythm:regular Rate:Normal ? ? ?  ?Neuro/Psych ?PSYCHIATRIC DISORDERS Depression negative neurological ROS ?   ? GI/Hepatic ?Neg liver ROS, GERD  Medicated,  ?Endo/Other  ?negative endocrine ROS ? Renal/GU ?negative Renal ROS  ?negative genitourinary ?  ?Musculoskeletal ? ? Abdominal ?  ?Peds ? Hematology ?negative hematology ROS ?(+)   ?Anesthesia Other Findings ? ? Reproductive/Obstetrics ?negative OB ROS ? ?  ? ? ? ? ? ? ? ? ? ? ? ? ? ?  ?  ? ? ? ? ? ? ? ? ?Anesthesia Physical ?Anesthesia Plan ? ?ASA: 2 ? ?Anesthesia Plan: General and General LMA  ? ?Post-op Pain Management:   ? ?Induction:  ? ?PONV Risk Score and Plan: Ondansetron ? ?Airway Management Planned:  ? ?Additional Equipment:  ? ?Intra-op Plan:  ? ?Post-operative Plan:  ? ?Informed Consent: I have reviewed the patients History and Physical, chart, labs and discussed the procedure including the risks, benefits and alternatives for the proposed anesthesia with the patient or authorized representative who has indicated his/her understanding and acceptance.  ? ? ? ?Dental Advisory Given ? ?Plan Discussed with: CRNA ? ?Anesthesia Plan Comments:   ? ? ? ? ? ? ?Anesthesia Quick Evaluation ? ?

## 2021-05-25 NOTE — Interval H&P Note (Signed)
History and Physical Interval Note: ? ?05/25/2021 ?1:26 PM ? ?Olivia Acevedo  has presented today for surgery, with the diagnosis of medial and lateral meniscus tears and osteoarthritis right knee.  The various methods of treatment have been discussed with the patient and family. After consideration of risks, benefits and other options for treatment, the patient has consented to  Procedure(s) with comments: ?KNEE ARTHROSCOPY WITH MEDIAL MENISECTOMY (Right) - possible lateral menisectomy as a surgical intervention.  The patient's history has been reviewed, patient examined, no change in status, stable for surgery.  I have reviewed the patient's chart and labs.  Questions were answered to the patient's satisfaction.   ? ? ?Arther Abbott ? ? ?

## 2021-05-25 NOTE — Brief Op Note (Signed)
05/25/2021 ? ?2:52 PM ? ?PATIENT:  Olivia Acevedo  61 y.o. female ? ?PRE-OPERATIVE DIAGNOSIS:  medial and lateral meniscus tears and osteoarthritis right knee ? ?POST-OPERATIVE DIAGNOSIS:  medial and lateral meniscus tears and osteoarthritis right knee ? ?Findings: Torn medial meniscus at the root radial tear, mild chondromalacia medial femoral condyle ? ?ACL PCL intact ? ?Mild chondromalacia lateral femoral condyle ? ?Lateral meniscal tear at the body ? ?Normal trochlea normal patella ? ?Small medial synovial plica ? ?PROCEDURE:  Procedure(s) with comments: ?KNEE ARTHROSCOPY WITH MEDIAL AND LATERAL MENISECTOMY (Right) - possible lateral menisectomy ? ?SURGEON:  Surgeon(s) and Role: ?   Vickki Hearing, MD - Primary ? ?PHYSICIAN ASSISTANT:  ? ?ASSISTANTS: none  ? ?ANESTHESIA:   general ? ?EBL:  none  ? ?BLOOD ADMINISTERED:none ? ?DRAINS: none  ? ?LOCAL MEDICATIONS USED:  MARCAINE    ? ?SPECIMEN:  No Specimen ? ?DISPOSITION OF SPECIMEN:  N/A ? ?COUNTS:  YES ? ?TOURNIQUET:  * Missing tourniquet times found for documented tourniquets in log: [id number] * ? ?DICTATION: .Dragon Dictation ? ?PLAN OF CARE: Discharge to home after PACU ? ?PATIENT DISPOSITION:  PACU - hemodynamically stable. ?  ?Delay start of Pharmacological VTE agent (>24hrs) due to surgical blood loss or risk of bleeding: not applicable ? ?Dictation ? ?Olivia Acevedo was seen in preop her leg was evaluated she was cleared for surgery and a chart review was completed including review of the MRI.  The surgical site was confirmed and marked his right knee ? ?Patient was taken the operating for general anesthesia with LMA technique ? ?She was in the supine position ? ?We placed a tourniquet on the right thigh but never elevated.  We placed a arthroscopic leg holder on her right knee area ? ?We placed the well leg in a well leg padded holder ? ?After sterile prep and drape timeout was completed ? ?I made a standard lateral portal.  I placed the scope in the  joint and then reviewed the knee starting in the suprapatellar region working around the medial gutter medial joint notch lateral joint and lateral gutter.  No loose bodies were found ? ?Torn medial meniscus was noted posterior horn at the root.  A spinal needle was made to make a portal medially and then a probe was placed into the joint it was a radial tear which was not otherwise displaceable.  I used a straight biting forcep to trim the meniscus and then remove the meniscal fragments with a torpedo blade shaver and then cleaned up the meniscal edges.  Probe was then introduced back into the joint to confirm stability of the remaining meniscus.  Estimated meniscal resection 15% of the posterior horn ? ?I then placed the leg in line figure-of-four position and then proceeded to use the shaver to resect the torn lateral meniscus.  This tear went all the way back to the meniscosynovial junction. ? ?The anterior horn and posterior horn were otherwise stable ? ?I irrigated the joint and then decided to resect the medial synovial plica.  I irrigated again and then remove the fluid from the joint with suction through the shaver and then injected through the scope to 60 cc of Marcaine with epinephrine ? ?I closed the portals with 1 nylon suture in each portal and then applied a sterile dressing and a Cryo/Cuff ? ?The patient was extubated taken to recovery room in stable condition ? ?She will be weightbearing as tolerated and will see me in a week ? ? ? ?

## 2021-05-26 ENCOUNTER — Encounter (HOSPITAL_COMMUNITY): Payer: Self-pay | Admitting: Orthopedic Surgery

## 2021-05-27 ENCOUNTER — Telehealth: Payer: Self-pay

## 2021-05-27 ENCOUNTER — Other Ambulatory Visit: Payer: Self-pay | Admitting: Orthopedic Surgery

## 2021-05-27 ENCOUNTER — Encounter (HOSPITAL_COMMUNITY): Payer: Self-pay | Admitting: Orthopedic Surgery

## 2021-05-27 DIAGNOSIS — G8918 Other acute postprocedural pain: Secondary | ICD-10-CM

## 2021-05-27 MED ORDER — HYDROCODONE-ACETAMINOPHEN 10-325 MG PO TABS: 1.0000 | ORAL_TABLET | ORAL | 0 refills | Status: AC | PRN

## 2021-05-27 NOTE — Telephone Encounter (Signed)
Ok but after 3 days tylenol and ibuprofen and ice

## 2021-05-27 NOTE — Telephone Encounter (Signed)
See message regarding backorder for medication ?

## 2021-05-27 NOTE — Progress Notes (Unsigned)
Encounter Diagnosis  ?Name Primary?  ? Post-operative pain Yes  ? ?No orders of the defined types were placed in this encounter. ? ? ?

## 2021-05-27 NOTE — Telephone Encounter (Signed)
Noted  

## 2021-05-27 NOTE — Telephone Encounter (Signed)
Patient's husband called to check about this and I relayed your message. ?He stated he understood. ?

## 2021-05-27 NOTE — Telephone Encounter (Signed)
Patient left message stating that [facility] in [place] has Hydrocodone-Acetaminophen 5/325 mg on back order. ?Stated they told her that if you would do a script for the 10's they have those in stock. She stated that she is  ?in a lot of pain and needs something. ? ? ?PATIENT USES [facility] ?

## 2021-06-01 ENCOUNTER — Encounter: Payer: Self-pay | Admitting: Orthopedic Surgery

## 2021-06-03 ENCOUNTER — Encounter: Payer: Self-pay | Admitting: Orthopedic Surgery

## 2021-06-03 ENCOUNTER — Ambulatory Visit (INDEPENDENT_AMBULATORY_CARE_PROVIDER_SITE_OTHER): Admitting: Orthopedic Surgery

## 2021-06-03 VITALS — Ht 67.0 in | Wt 176.0 lb

## 2021-06-03 DIAGNOSIS — Z9889 Other specified postprocedural states: Secondary | ICD-10-CM

## 2021-06-03 NOTE — Progress Notes (Signed)
FOLLOW UP  ? ?Encounter Diagnosis  ?Name Primary?  ? S/P right knee arthroscopy medial and lateral meniscectomy surgery date 05/25/2021 Yes  ? ? ? ?Chief Complaint  ?Patient presents with  ? Routine Post Op  ?  Rt Knee DOS 05/25/21  ? ? ? ?Olivia Acevedo is doing well she is not taking any opioids at this time.  She loves the ice cuff.  She has greater than 90 degrees of flexion and almost full extension.  The portals were closed with sutures and they were removed.  She had medial and lateral meniscal tears osteoarthritis of the right knee the medial meniscus tear was at the root estimated meniscal resection 15% of the posterior horn and then the lateral meniscus tear was at the body and went back to the meniscosynovial junction proximately 60% of the body was resected ? ?The plica was also resected ? ?Recommend home exercise program follow-up in 3 weeks ?

## 2021-06-24 ENCOUNTER — Ambulatory Visit (INDEPENDENT_AMBULATORY_CARE_PROVIDER_SITE_OTHER): Admitting: Orthopedic Surgery

## 2021-06-24 VITALS — Ht 67.0 in | Wt 176.0 lb

## 2021-06-24 DIAGNOSIS — Z9889 Other specified postprocedural states: Secondary | ICD-10-CM

## 2021-06-24 DIAGNOSIS — Z4889 Encounter for other specified surgical aftercare: Secondary | ICD-10-CM

## 2021-06-24 NOTE — Progress Notes (Signed)
Encounter Diagnoses  Name Primary?   S/P right knee arthroscopy medial and lateral meniscectomy surgery date 05/25/2021    Aftercare following surgery Yes   Chief Complaint  Patient presents with   Routine Post Op    Lt Knee arthroscopy  DOS 05/25/21 , has noticed a "nagging" pinch sensation above one of her incision sites.      Olivia Acevedo is doing well has regained full range of motion she has an occasional pinching sensation near the medial epicondyle and condyle which is most likely irritation from the arthroscopy especially if there is any type of plica  Otherwise she is doing well she can return as needed

## 2022-04-22 ENCOUNTER — Other Ambulatory Visit: Payer: Self-pay | Admitting: Nurse Practitioner

## 2022-04-22 DIAGNOSIS — Z1231 Encounter for screening mammogram for malignant neoplasm of breast: Secondary | ICD-10-CM

## 2022-04-30 ENCOUNTER — Other Ambulatory Visit: Payer: Self-pay

## 2022-04-30 ENCOUNTER — Emergency Department (HOSPITAL_BASED_OUTPATIENT_CLINIC_OR_DEPARTMENT_OTHER)

## 2022-04-30 ENCOUNTER — Emergency Department (HOSPITAL_BASED_OUTPATIENT_CLINIC_OR_DEPARTMENT_OTHER)
Admission: EM | Admit: 2022-04-30 | Discharge: 2022-04-30 | Disposition: A | Discharge status: Home / Self Care | Attending: Emergency Medicine | Admitting: Emergency Medicine

## 2022-04-30 ENCOUNTER — Encounter (HOSPITAL_BASED_OUTPATIENT_CLINIC_OR_DEPARTMENT_OTHER): Payer: Self-pay

## 2022-04-30 DIAGNOSIS — Z79899 Other long term (current) drug therapy: Secondary | ICD-10-CM | POA: Diagnosis not present

## 2022-04-30 DIAGNOSIS — R079 Chest pain, unspecified: Secondary | ICD-10-CM | POA: Diagnosis present

## 2022-04-30 DIAGNOSIS — I10 Essential (primary) hypertension: Secondary | ICD-10-CM | POA: Diagnosis not present

## 2022-04-30 DIAGNOSIS — R0602 Shortness of breath: Secondary | ICD-10-CM | POA: Insufficient documentation

## 2022-04-30 LAB — D-DIMER, QUANTITATIVE: D-Dimer, Quant: 0.69 ug/mL-FEU — ABNORMAL HIGH (ref 0.00–0.50)

## 2022-04-30 LAB — CBC WITH DIFFERENTIAL/PLATELET
Abs Immature Granulocytes: 0.02 10*3/uL (ref 0.00–0.07)
Basophils Absolute: 0 10*3/uL (ref 0.0–0.1)
Basophils Relative: 0 %
Eosinophils Absolute: 0.1 10*3/uL (ref 0.0–0.5)
Eosinophils Relative: 1 %
HCT: 37.7 % (ref 36.0–46.0)
Hemoglobin: 12.8 g/dL (ref 12.0–15.0)
Immature Granulocytes: 0 %
Lymphocytes Relative: 11 %
Lymphs Abs: 1 10*3/uL (ref 0.7–4.0)
MCH: 29 pg (ref 26.0–34.0)
MCHC: 34 g/dL (ref 30.0–36.0)
MCV: 85.5 fL (ref 80.0–100.0)
Monocytes Absolute: 0.9 10*3/uL (ref 0.1–1.0)
Monocytes Relative: 9 %
Neutro Abs: 7.3 10*3/uL (ref 1.7–7.7)
Neutrophils Relative %: 79 %
Platelets: 190 10*3/uL (ref 150–400)
RBC: 4.41 MIL/uL (ref 3.87–5.11)
RDW: 12.7 % (ref 11.5–15.5)
WBC: 9.2 10*3/uL (ref 4.0–10.5)
nRBC: 0 % (ref 0.0–0.2)

## 2022-04-30 LAB — COMPREHENSIVE METABOLIC PANEL
ALT: 9 U/L (ref 0–44)
AST: 13 U/L — ABNORMAL LOW (ref 15–41)
Albumin: 4.4 g/dL (ref 3.5–5.0)
Alkaline Phosphatase: 53 U/L (ref 38–126)
Anion gap: 9 (ref 5–15)
BUN: 10 mg/dL (ref 6–20)
CO2: 27 mmol/L (ref 22–32)
Calcium: 9.4 mg/dL (ref 8.9–10.3)
Chloride: 101 mmol/L (ref 98–111)
Creatinine, Ser: 0.67 mg/dL (ref 0.44–1.00)
GFR, Estimated: >60 mL/min (ref >60)
Glucose, Bld: 136 mg/dL — ABNORMAL HIGH (ref 70–99)
Potassium: 3.3 mmol/L — ABNORMAL LOW (ref 3.5–5.1)
Sodium: 137 mmol/L (ref 135–145)
Total Bilirubin: 0.5 mg/dL (ref 0.3–1.2)
Total Protein: 7.3 g/dL (ref 6.5–8.1)

## 2022-04-30 LAB — TROPONIN I (HIGH SENSITIVITY)
Troponin I (High Sensitivity): 4 ng/L (ref <18)
Troponin I (High Sensitivity): 4 ng/L (ref <18)

## 2022-04-30 MED ORDER — IBUPROFEN 800 MG PO TABS: 800.0000 mg | ORAL_TABLET | Freq: Three times a day (TID) | ORAL | 0 refills | Status: DC | PRN

## 2022-04-30 MED ORDER — IOHEXOL 350 MG/ML SOLN
100.0000 mL | Freq: Once | INTRAVENOUS | Status: AC | PRN
  Administered 2022-04-30: 80 mL via INTRAVENOUS

## 2022-04-30 MED ORDER — KETOROLAC TROMETHAMINE 15 MG/ML IJ SOLN
15.0000 mg | Freq: Once | INTRAMUSCULAR | Status: AC
  Administered 2022-04-30: 15 mg via INTRAVENOUS
  Filled 2022-04-30: qty 1

## 2022-04-30 MED ORDER — FAMOTIDINE 20 MG PO TABS
20.0000 mg | ORAL_TABLET | Freq: Once | ORAL | Status: AC
  Administered 2022-04-30: 20 mg via ORAL
  Filled 2022-04-30: qty 1

## 2022-04-30 MED ORDER — ALUM & MAG HYDROXIDE-SIMETH 200-200-20 MG/5ML PO SUSP
15.0000 mL | Freq: Once | ORAL | Status: AC
  Administered 2022-04-30: 15 mL via ORAL
  Filled 2022-04-30: qty 30

## 2022-04-30 MED ORDER — HYDROCODONE-ACETAMINOPHEN 5-325 MG PO TABS
1.0000 | ORAL_TABLET | Freq: Once | ORAL | Status: AC
  Administered 2022-04-30: 1 via ORAL
  Filled 2022-04-30: qty 1

## 2022-04-30 MED ORDER — KETOROLAC TROMETHAMINE 10 MG PO TABS: 10.0000 mg | ORAL_TABLET | Freq: Four times a day (QID) | ORAL | 0 refills | Status: AC | PRN

## 2022-04-30 MED ORDER — FENTANYL CITRATE PF 50 MCG/ML IJ SOSY
25.0000 ug | PREFILLED_SYRINGE | Freq: Once | INTRAMUSCULAR | Status: AC
  Administered 2022-04-30: 25 ug via INTRAVENOUS
  Filled 2022-04-30: qty 1

## 2022-04-30 NOTE — Discharge Instructions (Addendum)
Your workup today was overall reassuring.  As discussed, we will send in medication to use as needed for pain/discomfort.  Will also send in referral for cardiology for close follow-up and further assessment.  Please do not hesitate to return to emergency department if the worrisome signs and symptoms we discussed become apparent.

## 2022-04-30 NOTE — Progress Notes (Signed)
Pt's breath sounds were clear. Pt stated that her left side of her breast to her shoulder was where she felt SOB and one area on the left. RT will continue to monitor

## 2022-04-30 NOTE — ED Triage Notes (Signed)
Shortness of breath started suddenly this morning at 0200. +L sided chest pain with radiation to L shouder. Denies N/V, fever, chills.

## 2022-04-30 NOTE — ED Provider Notes (Signed)
[facility] Provider Note   CSN: [id number] Arrival date & time: 04/30/22  [id number]     History  Chief Complaint  Patient presents with   Shortness of Breath    Olivia Acevedo is a 62 y.o. female.   Shortness of Breath   62 year old female presents emergency department with complaints of left-sided chest pain and shortness of breath.  Patient is abrupt onset of symptoms around 2 AM awakening her from sleep.  States that chest pain is left-sided in nature and feels like pressure and as if something is sitting on her chest.  She reports pain radiates to left shoulder.  Reports pain worsened with taking a deep breath, coughing.  Denies history of similar symptoms in the past.  States that pain somewhat resolved around 5 AM before returning prompting her visit to the emergency department.  Reports history of hypertension.  Denies history of cigarette use.  Denies fever, cough, congestion, abdominal pain, nausea, vomiting.  Denies history of DVT/PE, recent surgery/immobilization, known malignancy, hormonal therapy, coagulopathy.  Patient does state that she had elevated D-dimer in the past when assessing lower extremity swelling but never followed up with imaging with primary care provider.  Past medical history significant for hypertension, GERD, depression  Home Medications Prior to Admission medications   Medication Sig Start Date End Date Taking? Authorizing Provider  ketorolac (TORADOL) 10 MG tablet Take 1 tablet (10 mg total) by mouth every 6 (six) hours as needed. 04/30/22  Yes Dion Saucier A, PA  amLODipine (NORVASC) 5 MG tablet Take 5 mg by mouth daily.    [provider]  atorvastatin (LIPITOR) 10 MG tablet Take 10 mg by mouth at bedtime. 03/10/21   [provider]  esomeprazole (NEXIUM) 20 MG capsule Take 20 mg by mouth daily as needed (Heart burn).    [provider]  hydrochlorothiazide (HYDRODIURIL) 25 MG  tablet Take 25 mg by mouth daily.    [provider]  lisinopril (PRINIVIL,ZESTRIL) 40 MG tablet Take 40 mg by mouth daily.    [provider]  Misc Natural Products (NEURIVA) CHEW Chew 1 capsule by mouth daily.    [provider]  promethazine (PHENERGAN) 12.5 MG tablet Take 1 tablet (12.5 mg total) by mouth every 6 (six) hours as needed for nausea or vomiting. Patient not taking: Reported on 06/03/2021 05/25/21   Carole Civil, MD  sertraline (ZOLOFT) 100 MG tablet Take 100 mg by mouth daily.    [provider]  valACYclovir (VALTREX) 500 MG tablet Take 500 mg by mouth daily as needed (Cold sore).    [provider]      Allergies    Shellfish allergy and Vancomycin    Review of Systems   Review of Systems  Respiratory:  Positive for shortness of breath.   All other systems reviewed and are negative.   Physical Exam Updated Vital Signs BP 130/69   Pulse 82   Temp 98.6 F (37 C) (Oral)   Resp 18   SpO2 99%  Physical Exam Vitals and nursing note reviewed.  Constitutional:      General: She is not in acute distress.    Appearance: She is well-developed.  HENT:     Head: Normocephalic and atraumatic.  Eyes:     Conjunctiva/sclera: Conjunctivae normal.  Neck:     Vascular: No JVD.  Cardiovascular:     Rate and Rhythm: Normal rate and regular rhythm.     Pulses:  Normal pulses.     Heart sounds: No murmur heard. Pulmonary:     Effort: Pulmonary effort is normal. No respiratory distress.     Breath sounds: Normal breath sounds. No wheezing, rhonchi or rales.     Comments: Minimal left anterior chest wall tenderness. Abdominal:     Palpations: Abdomen is soft.     Tenderness: There is no abdominal tenderness. There is no guarding.  Musculoskeletal:        General: No swelling.     Cervical back: Neck supple.     Right lower leg: No edema.     Left lower leg: No edema.  Skin:    General: Skin is warm and dry.      Capillary Refill: Capillary refill takes less than 2 seconds.  Neurological:     Mental Status: She is alert.  Psychiatric:        Mood and Affect: Mood normal.     ED Results / Procedures / Treatments   Labs (all labs ordered are listed, but only abnormal results are displayed) Labs Reviewed  COMPREHENSIVE METABOLIC PANEL - Abnormal; Notable for the following components:      Result Value   Potassium 3.3 (*)    Glucose, Bld 136 (*)    AST 13 (*)    All other components within normal limits  D-DIMER, QUANTITATIVE - Abnormal; Notable for the following components:   D-Dimer, Quant 0.69 (*)    All other components within normal limits  CBC WITH DIFFERENTIAL/PLATELET  TROPONIN I (HIGH SENSITIVITY)  TROPONIN I (HIGH SENSITIVITY)    EKG EKG Interpretation  Date/Time:  Saturday April 30 2022 09:41:38 EDT Ventricular Rate:  90 PR Interval:  152 QRS Duration: 93 QT Interval:  346 QTC Calculation: 424 R Axis:   47 Text Interpretation: Sinus rhythm Right atrial enlargement Minimal ST elevation, anterior leads No prior tracing for comparison Confirmed by Nanda Quinton [phone]) on 04/30/2022 9:45:28 AM  Radiology CT Angio Chest PE W/Cm &/Or Wo Cm  Result Date: 04/30/2022 CLINICAL DATA:  Shortness of breath and chest pain. EXAM: CT ANGIOGRAPHY CHEST WITH CONTRAST TECHNIQUE: Multidetector CT imaging of the chest was performed using the standard protocol during bolus administration of intravenous contrast. Multiplanar CT image reconstructions and MIPs were obtained to evaluate the vascular anatomy. RADIATION DOSE REDUCTION: This exam was performed according to the departmental dose-optimization program which includes automated exposure control, adjustment of the mA and/or kV according to patient size and/or use of iterative reconstruction technique. CONTRAST:  32mL OMNIPAQUE IOHEXOL 350 MG/ML SOLN COMPARISON:  None Available. FINDINGS: Cardiovascular: The heart size is normal. No substantial  pericardial effusion. Coronary artery calcification is evident. Mild atherosclerotic calcification is noted in the wall of the thoracic aorta. There is no filling defect within the opacified pulmonary arteries to suggest the presence of an acute pulmonary embolus. Mediastinum/Nodes: No mediastinal lymphadenopathy. There is no hilar lymphadenopathy. The esophagus has normal imaging features. There is no axillary lymphadenopathy. Lungs/Pleura: No focal airspace consolidation. No pleural effusion. No suspicious pulmonary nodule or mass Upper Abdomen: Evidence of prior lap band surgery. Musculoskeletal: No worrisome lytic or sclerotic osseous abnormality. Review of the MIP images confirms the above findings. IMPRESSION: 1. No CT evidence for acute pulmonary embolus. 2. Coronary artery calcification. 3.  Aortic Atherosclerosis (ICD10-I70.0). Electronically Signed   By: Misty Stanley M.D.   On: 04/30/2022 11:48   DG Chest Port 1 View  Result Date: 04/30/2022 CLINICAL DATA:  Shortness of breath with left-sided chest  pain radiating to the shoulder. EXAM: PORTABLE CHEST 1 VIEW COMPARISON:  None Available. FINDINGS: Normal heart size and mediastinal contours. No acute infiltrate or edema. No effusion or pneumothorax. No acute osseous findings. Artifact from EKG leads. IMPRESSION: No active disease. Electronically Signed   By: Jorje Guild M.D.   On: 04/30/2022 09:58    Procedures Procedures    Medications Ordered in ED Medications  fentaNYL (SUBLIMAZE) injection 25 mcg (25 mcg Intravenous Given 04/30/22 1005)  famotidine (PEPCID) tablet 20 mg (20 mg Oral Given 04/30/22 1055)  alum & mag hydroxide-simeth (MAALOX/MYLANTA) 200-200-20 MG/5ML suspension 15 mL (15 mLs Oral Given 04/30/22 1055)  iohexol (OMNIPAQUE) 350 MG/ML injection 100 mL (80 mLs Intravenous Contrast Given 04/30/22 1124)  HYDROcodone-acetaminophen (NORCO/VICODIN) 5-325 MG per tablet 1 tablet (1 tablet Oral Given 04/30/22 1216)  ketorolac (TORADOL)  15 MG/ML injection 15 mg (15 mg Intravenous Given 04/30/22 1244)    ED Course/ Medical Decision Making/ A&P                             Medical Decision Making Amount and/or Complexity of Data Reviewed Labs: ordered. Radiology: ordered.  Risk OTC drugs. Prescription drug management.   This patient presents to the ED for concern of shortness of breath/chest pain, this involves an extensive number of treatment options, and is a complaint that carries with it a high risk of complications and morbidity.  The differential diagnosis includes The causes for shortness of breath include but are not limited to Cardiac (AHF, pericardial effusion and tamponade, arrhythmias, ischemia, etc) Respiratory (COPD, asthma, pneumonia, pneumothorax, primary pulmonary hypertension, PE/VQ mismatch) Hematological (anemia)  Co morbidities that complicate the patient evaluation  See HPI   Additional history obtained:  Additional history obtained from EMR External records from outside source obtained and reviewed including hospital records   Lab Tests:  I Ordered, and personally interpreted labs.  The pertinent results include: No leukocytosis noted.  No evidence of anemia.  Platelets within range.  D-dimer mildly elevated at 1.69.  Mild hypokalemia but otherwise, electrolytes within normal range.  No transaminitis.  No renal dysfunction.  D-dimer elevated 0.69 of which CT PE study was ordered which was negative.  Initial troponin of 4 with repeat for   Imaging Studies ordered:  I ordered imaging studies including chest x-ray, CT angio chest PE I independently visualized and interpreted imaging which showed  Chest x-ray: No acute cardiopulmonary abnormality CT angio chest PE: No evidence of acute PE.  Coronary artery calcifications.  Aortic atherosclerosis. I agree with the radiologist interpretation  Cardiac Monitoring: / EKG:  The patient was maintained on a cardiac monitor.  I personally viewed  and interpreted the cardiac monitored which showed an underlying rhythm of: Sinus rhythm with right atrial enlargement with mild ST elevation in anterior leads.  No prior EKGs for comparison.   Consultations Obtained:  N/a   Problem List / ED Course / Critical interventions / Medication management  Chest pain/shortness of breath I ordered medication including fentanyl, Maalox, famotidine   Reevaluation of the patient after these medicines showed that the patient improved I have reviewed the patients home medicines and have made adjustments as needed   Social Determinants of Health:  Denies tobacco, illicit drug use.   Test / Admission - Considered:  Chest pain/shortness of breath Vitals signs within normal range and stable throughout visit. Laboratory/imaging studies significant for: See above 62 year old female presents emergency department with complaints  of chest pain/shortness of breath.  Workup today overall reassuring.  Low suspicion for ACS given delta negative troponin, nonspecific ST changes; patient heart score 0-3.  Doubt PE given negative PE study.  Doubt dissection, pneumonia, pneumothorax, CHF, peritonitis/myocarditis/tamponade.  Patient's pain controlled while in the emergency department.  Patient overall well-appearing, afebrile in no acute distress.  Given reassuring workup, patient deemed safe for outpatient follow-up.  Referral for cardiology will be sent in for reassessment.  Treatment plan discussed at length with patient and she acknowledged understanding was agreeable to said plan.  Strict return precautions were discussed at length. Worrisome signs and symptoms were discussed with the patient, and the patient acknowledged understanding to return to the ED if noticed. Patient was stable upon discharge.           Final Clinical Impression(s) / ED Diagnoses Final diagnoses:  Chest pain, unspecified type    Rx / DC Orders ED Discharge Orders           Ordered    ibuprofen (ADVIL) 800 MG tablet  Every 8 hours PRN,   Status:  Discontinued        04/30/22 1236    Ambulatory referral to Cardiology        04/30/22 1236    ketorolac (TORADOL) 10 MG tablet  Every 6 hours PRN        04/30/22 1313              Wilnette Kales, [place] 04/30/22 1323    Margette Fast, MD 05/01/22 [phone]

## 2022-04-30 NOTE — ED Notes (Signed)
Patient transported to CT 

## 2022-05-03 ENCOUNTER — Encounter

## 2022-06-01 DIAGNOSIS — R931 Abnormal findings on diagnostic imaging of heart and coronary circulation: Secondary | ICD-10-CM | POA: Insufficient documentation

## 2022-06-01 DIAGNOSIS — R072 Precordial pain: Secondary | ICD-10-CM | POA: Insufficient documentation

## 2022-06-01 NOTE — Progress Notes (Unsigned)
Cardiology Office Note   Date:  06/02/2022   ID:  Olivia Acevedo, DOB 1960/07/01, MRN [id number]  PCP:  Wendall Papa  Cardiologist:   Rollene Rotunda, MD Referring:  Wendall Papa  Chief Complaint  Patient presents with   Chest Pain      History of Present Illness: Olivia Acevedo is a 62 y.o. female who presents for evaluation of chest pain.  She was in the ED in March for this.  I reviewed these records for this visit.    She did have a CT with no PE but she did have elevated coronary calcium.     She says she was treated in the emergency room with IV ketorolac.  She was sent home with nonsteroidals.  She eventually got better.  This was a sharp pain that woke her from her sleep and was associated shortness of breath.  It was worse lying down.  There was no radiation to her jaw or to her arms.  It was intense and constant.  She never had this before.  She has not been having this since.  She did not have associated nausea vomiting or diaphoresis.  She has been walking 4 times a week for about the past year and does not bring on any symptoms with this.  She has no resting PND or orthopnea.  She has had significant weight loss intentionally over the years.  She actually had gastric banding but it was deflated years ago because she had problems with it.  She has been on a keto diet.   Past Medical History:  Diagnosis Date   Depression    Diarrhea 11/09/2017   GERD (gastroesophageal reflux disease)    Herpes simplex    Hypertension     Past Surgical History:  Procedure Laterality Date   BIOPSY  12/19/2018   Procedure: BIOPSY;  Surgeon: Malissa Hippo, MD;  Location: [facility];  Service: Endoscopy;;   CHOLECYSTECTOMY  2011   COLONOSCOPY N/A 12/19/2018   Procedure: COLONOSCOPY;  Surgeon: Malissa Hippo, MD;  Location: [facility];  Service: Endoscopy;  Laterality: N/A;  225pm   ENDOMETRIAL ABLATION     2000   ESOPHAGOGASTRODUODENOSCOPY N/A 01/15/2015    Procedure: ESOPHAGOGASTRODUODENOSCOPY (EGD);  Surgeon: Malissa Hippo, MD;  Location: [facility];  Service: Endoscopy;  Laterality: N/A;  2:10   KNEE ARTHROSCOPY WITH MEDIAL MENISECTOMY Right 05/25/2021   Procedure: KNEE ARTHROSCOPY WITH MEDIAL AND LATERAL MENISECTOMY;  Surgeon: Vickki Hearing, MD;  Location: [facility];  Service: Orthopedics;  Laterality: Right;  possible lateral menisectomy   LAPAROSCOPIC GASTRIC BANDING     2008   TONSILLECTOMY     1966     Current Outpatient Medications  Medication Sig Dispense Refill   atorvastatin (LIPITOR) 10 MG tablet Take 10 mg by mouth at bedtime.     esomeprazole (NEXIUM) 20 MG capsule Take 20 mg by mouth daily as needed (Heart burn).     ketorolac (TORADOL) 10 MG tablet Take 1 tablet (10 mg total) by mouth every 6 (six) hours as needed. 20 tablet 0   lisinopril-hydrochlorothiazide (ZESTORETIC) 20-25 MG tablet Take 1 tablet by mouth daily.     Misc Natural Products (NEURIVA) CHEW Chew 1 capsule by mouth daily.     sertraline (ZOLOFT) 100 MG tablet Take 100 mg by mouth daily.     valACYclovir (VALTREX) 500 MG tablet Take 500 mg by mouth daily as needed (Cold sore).  amLODipine (NORVASC) 5 MG tablet Take 5 mg by mouth daily. (Patient not taking: Reported on 06/02/2022)     hydrochlorothiazide (HYDRODIURIL) 25 MG tablet Take 25 mg by mouth daily. (Patient not taking: Reported on 06/02/2022)     lisinopril (PRINIVIL,ZESTRIL) 40 MG tablet Take 40 mg by mouth daily. (Patient not taking: Reported on 06/02/2022)     promethazine (PHENERGAN) 12.5 MG tablet Take 1 tablet (12.5 mg total) by mouth every 6 (six) hours as needed for nausea or vomiting. (Patient not taking: Reported on 06/02/2022) 30 tablet 0   No current facility-administered medications for this visit.    Allergies:   Shellfish allergy and Vancomycin    Social History:  The patient  reports that she has never smoked. She has never used smokeless tobacco. She reports current alcohol  use. She reports that she does not use drugs.   Family History:  The patient's family history is not on file.    ROS:  Please see the history of present illness.   Otherwise, review of systems are positive for none.   All other systems are reviewed and negative.    PHYSICAL EXAM: VS:  BP 132/76 (BP Location: Left Arm, Patient Position: Sitting, Cuff Size: Normal)   Pulse 68   Ht 5\' 7"  (1.702 m)   Wt 157 lb (71.2 kg)   SpO2 95%   BMI 24.59 kg/m  , BMI Body mass index is 24.59 kg/m. GENERAL:  Well appearing HEENT:  Pupils equal round and reactive, fundi not visualized, oral mucosa unremarkable NECK:  No jugular venous distention, waveform within normal limits, carotid upstroke brisk and symmetric, no bruits, no thyromegaly LYMPHATICS:  No cervical, inguinal adenopathy LUNGS:  Clear to auscultation bilaterally BACK:  No CVA tenderness CHEST:  Unremarkable HEART:  PMI not displaced or sustained,S1 and S2 within normal limits, no S3, no S4, no clicks, no rubs, no murmurs ABD:  Flat, positive bowel sounds normal in frequency in pitch, no bruits, no rebound, no guarding, no midline pulsatile mass, no hepatomegaly, no splenomegaly EXT:  2 plus pulses throughout, no edema, no cyanosis no clubbing SKIN:  No rashes no nodules NEURO:  Cranial nerves II through XII grossly intact, motor grossly intact throughout PSYCH:  Cognitively intact, oriented to person place and time    EKG:  EKG is not ordered today. The ekg ordered 04/30/2022 demonstrates sinus rhythm, rate 98, axis within normal limits, intervals within normal limits, poor anterior R wave progression, no acute ST-T wave changes.   Recent Labs: 04/30/2022: ALT 9; BUN 10; Creatinine, Ser 0.67; Hemoglobin 12.8; Platelets 190; Potassium 3.3; Sodium 137    Lipid Panel No results found for: "CHOL", "TRIG", "HDL", "CHOLHDL", "VLDL", "LDLCALC", "LDLDIRECT"    Wt Readings from Last 3 Encounters:  06/02/22 157 lb (71.2 kg)  06/24/21  176 lb (79.8 kg)  06/03/21 176 lb (79.8 kg)      Other studies Reviewed: Additional studies/ records that were reviewed today include: ER records, labs. Review of the above records demonstrates:  Please see elsewhere in the note.     ASSESSMENT AND PLAN:  Chest pain: She has never further chest pain.  Workup will be as below.  This probably was pleuritic.  Elevated coronary calcium: This is noted incidentally along with some aortic atherosclerosis.  I am going to screen her with a POET (Plain Old Exercise Treadmill).  I agree with aggressive risk reduction.  Dyslipidemia: The LDL is mildly elevated at 161.  She was recently started  on Lipitor and I agree with this given her family history.  I am going to have her come back to this office for an NMR and LP(a) in 3 months.  We talked about switching to a Mediterranean plant-based diet from the keto diet.  Current medicines are reviewed at length with the patient today.  The patient does not have concerns regarding medicines.  The following changes have been made:  no change  Labs/ tests ordered today include:   Orders Placed This Encounter  Procedures   NMR, lipoprofile   Lipoprotein A (LPA)   EXERCISE TOLERANCE TEST (ETT)     Disposition:   FU with as needed based on the results of the above   Signed, Rollene Rotunda, MD  06/02/2022 3:27 PM    [facility]

## 2022-06-02 ENCOUNTER — Encounter: Payer: Self-pay | Admitting: Cardiology

## 2022-06-02 ENCOUNTER — Ambulatory Visit: Attending: Cardiology | Admitting: Cardiology

## 2022-06-02 VITALS — BP 132/76 | HR 68 | Ht 67.0 in | Wt 157.0 lb

## 2022-06-02 DIAGNOSIS — R931 Abnormal findings on diagnostic imaging of heart and coronary circulation: Secondary | ICD-10-CM

## 2022-06-02 DIAGNOSIS — R072 Precordial pain: Secondary | ICD-10-CM

## 2022-06-02 NOTE — Patient Instructions (Signed)
Medication Instructions:  Your physician recommends that you continue on your current medications as directed. Please refer to the Current Medication list given to you today.  *If you need a refill on your cardiac medications before your next appointment, please call your pharmacy*   Lab Work: Your physician recommends that you return for lab work drawn when you have been fasting: NMR, Lp(a)  If you have labs (blood work) drawn today and your tests are completely normal, you will receive your results only by: MyChart Message (if you have MyChart) OR A paper copy in the mail If you have any lab test that is abnormal or we need to change your treatment, we will call you to review the results.   Testing/Procedures: Exercise Stress Test   Please arrive 15 minutes prior to your appointment time for registration and insurance purposes.   The test will take approximately 45 minutes to complete.   How to prepare for your Exercise Stress Test: Do bring a list of your current medications with you.  If not listed below, you may take your medications as normal. Do wear comfortable clothes (no dresses or overalls) and walking shoes, tennis shoes preferred (no heels or open toed shoes are allowed) Do Not wear cologne, perfume, aftershave or lotions (deodorant is allowed). Please report to [address] [facility], [address] for your test.   If these instructions are not followed, your test will have to be rescheduled.   If you have questions or concerns about your appointment, you can call the Stress Lab at [phone].   If you cannot keep your appointment, please provide 24 hours notification to the Stress Lab, to avoid a possible $50 charge to your account.    Follow-Up: At [facility], you and your health needs are our priority.  As part of our continuing mission to provide you with exceptional heart care, we have created designated Provider Care Teams.  These Care Teams include  your primary Cardiologist (physician) and Advanced Practice Providers (APPs -  Physician Assistants and Nurse Practitioners) who all work together to provide you with the care you need, when you need it.  We recommend signing up for the patient portal called "MyChart".  Sign up information is provided on this After Visit Summary.  MyChart is used to connect with patients for Virtual Visits (Telemedicine).  Patients are able to view lab/test results, encounter notes, upcoming appointments, etc.  Non-urgent messages can be sent to your provider as well.   To learn more about what you can do with MyChart, go to [email or web address].    Your next appointment:   As needed  Provider:   Rollene Rotunda, MD

## 2022-06-09 ENCOUNTER — Telehealth (HOSPITAL_COMMUNITY): Payer: Self-pay | Admitting: *Deleted

## 2022-06-09 NOTE — Telephone Encounter (Signed)
.  Patient given detailed instructions per Stress Test Requisition Sheet for test on 06/13/2022 at 3:30.Patient Notified to arrive 30 minutes early, and that it is imperative to arrive on time for appointment to keep from having the test rescheduled.  Patient verbalized understanding. Olivia Acevedo

## 2022-06-13 ENCOUNTER — Ambulatory Visit (HOSPITAL_COMMUNITY)

## 2022-07-07 ENCOUNTER — Telehealth (HOSPITAL_COMMUNITY): Payer: Self-pay | Admitting: *Deleted

## 2022-07-07 NOTE — Telephone Encounter (Signed)
Pt reached and given instructions for ETT scheduled on 07/14/22.

## 2022-07-14 ENCOUNTER — Ambulatory Visit (HOSPITAL_COMMUNITY): Attending: Cardiology

## 2022-07-14 DIAGNOSIS — R072 Precordial pain: Secondary | ICD-10-CM | POA: Insufficient documentation

## 2022-07-14 DIAGNOSIS — R931 Abnormal findings on diagnostic imaging of heart and coronary circulation: Secondary | ICD-10-CM | POA: Insufficient documentation

## 2022-07-14 LAB — EXERCISE TOLERANCE TEST
Base ST Depression (mm): 0 mm
Estimated workload: 10.1
Exercise duration (min): 9 min
Exercise duration (sec): 0 s
MPHR: 160 {beats}/min
Peak HR: 153 {beats}/min
Percent HR: 95 %
Rest HR: 67 {beats}/min
ST Depression (mm): 1.5 mm

## 2022-07-18 ENCOUNTER — Encounter: Payer: Self-pay | Admitting: *Deleted

## 2022-09-19 ENCOUNTER — Encounter: Payer: Self-pay | Admitting: *Deleted

## 2022-10-22 IMAGING — DX DG KNEE 1-2V*L*
1 series · 2 of 2 positions shown · non-contrast
Comparison: None.

CLINICAL DATA: Left knee pain

EXAM:
LEFT KNEE - 2 VIEW

[Series 1: knee · 0.14mm/px · 2 of 2 slices shown]
[im 1/2]
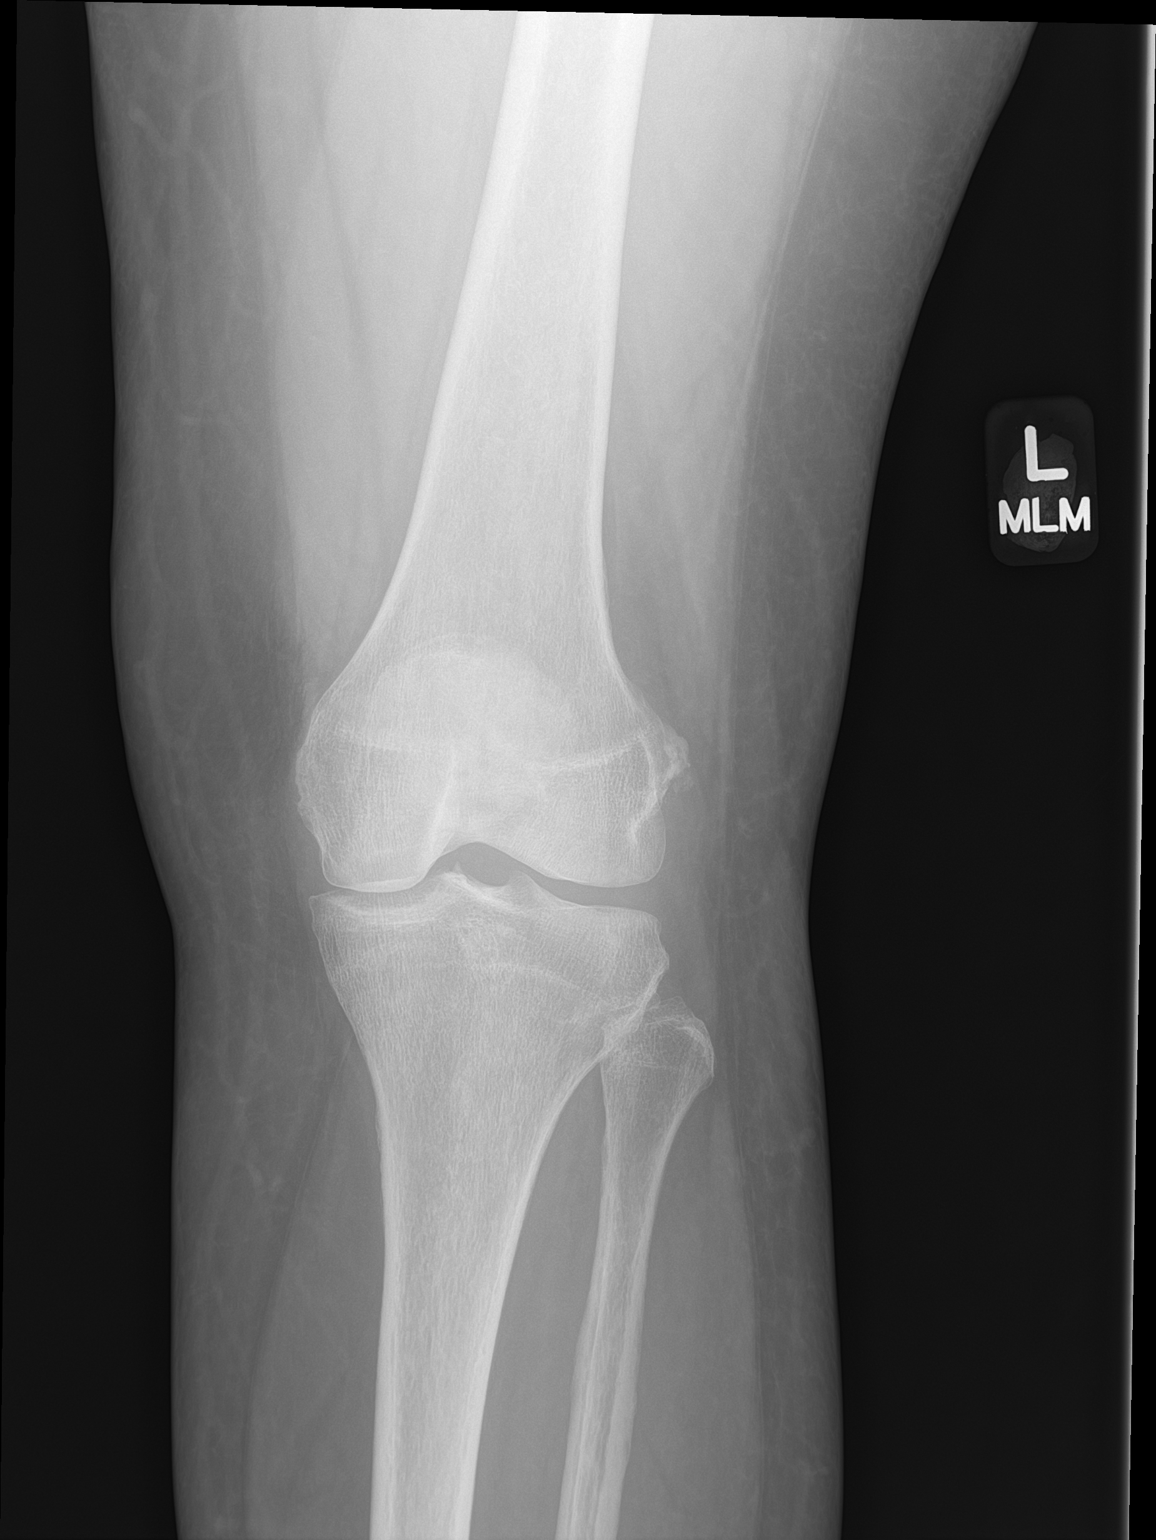
[im 2/2]
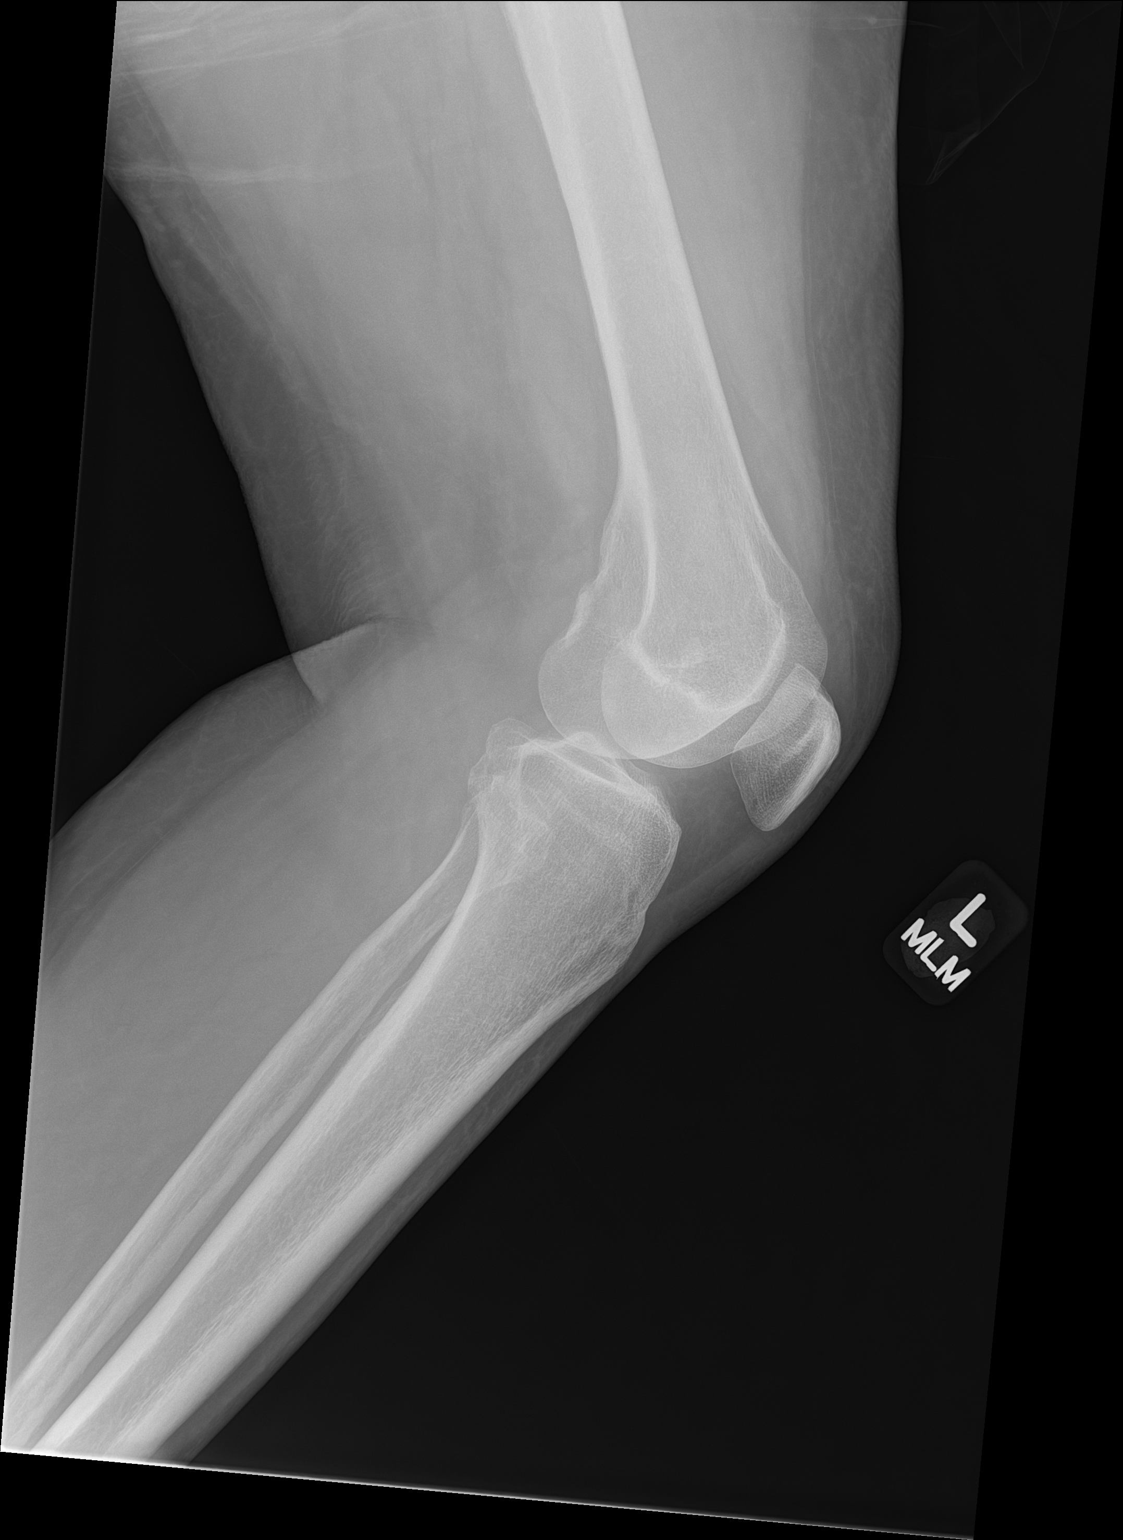

[2 of 2 positions shown; findings below may reference images not displayed]

FINDINGS: No evidence of fracture, dislocation, or joint effusion. No evidence
of arthropathy or other focal bone abnormality. Soft tissues are
unremarkable.
IMPRESSION: Negative.

## 2022-11-24 LAB — NMR, LIPOPROFILE
Cholesterol, Total: 212 mg/dL — ABNORMAL HIGH (ref 100–199)
HDL Particle Number: 32.9 umol/L (ref >=30.5)
HDL-C: 62 mg/dL (ref >39)
LDL Particle Number: 1442 nmol/L — ABNORMAL HIGH (ref <1000)
LDL Size: 21.4 nmol (ref >20.5)
LDL-C (NIH Calc): 135 mg/dL — ABNORMAL HIGH (ref 0–99)
LP-IR Score: <25 (ref <=45)
Small LDL Particle Number: 415 nmol/L (ref <=527)
Triglycerides: 87 mg/dL (ref 0–149)

## 2022-11-24 LAB — LIPOPROTEIN A (LPA): Lipoprotein (a): 17.2 nmol/L (ref <75.0)

## 2022-11-28 ENCOUNTER — Telehealth: Payer: Self-pay | Admitting: *Deleted

## 2022-11-28 DIAGNOSIS — R931 Abnormal findings on diagnostic imaging of heart and coronary circulation: Secondary | ICD-10-CM

## 2022-11-28 MED ORDER — ATORVASTATIN CALCIUM 40 MG PO TABS: 40.0000 mg | ORAL_TABLET | Freq: Every day | ORAL | 3 refills | Status: DC

## 2022-11-28 NOTE — Telephone Encounter (Signed)
pt aware of results  New script sent to the pharmacy  Lab orders mailed to the pt  

## 2022-11-28 NOTE — Telephone Encounter (Signed)
-----   Message from Rollene Rotunda sent at 11/26/2022  8:36 AM EDT ----- LDL is very elevated and particle size not optimal.  I would like her to try 40 mg of Lipitor if she would agree.  LPa is pending.   Repeat lipid and liver in 3 months. Call Ms. Petruso with the results and send results to [facility]

## 2023-03-12 ENCOUNTER — Other Ambulatory Visit: Payer: Self-pay | Admitting: Cardiology

## 2023-03-12 DIAGNOSIS — E7800 Pure hypercholesterolemia, unspecified: Secondary | ICD-10-CM

## 2023-03-15 ENCOUNTER — Encounter: Payer: Self-pay | Admitting: *Deleted

## 2023-04-11 ENCOUNTER — Inpatient Hospital Stay: Admission: RE | Admit: 2023-04-11 | Source: Ambulatory Visit

## 2023-04-11 ENCOUNTER — Other Ambulatory Visit: Payer: Self-pay | Admitting: Nurse Practitioner

## 2023-04-11 DIAGNOSIS — Z1231 Encounter for screening mammogram for malignant neoplasm of breast: Secondary | ICD-10-CM

## 2023-05-09 ENCOUNTER — Ambulatory Visit
Admission: RE | Admit: 2023-05-09 | Discharge: 2023-05-09 | Disposition: A | Discharge status: Home / Self Care | Source: Ambulatory Visit | Attending: Nurse Practitioner | Admitting: Nurse Practitioner

## 2023-05-09 DIAGNOSIS — Z1231 Encounter for screening mammogram for malignant neoplasm of breast: Secondary | ICD-10-CM

## 2024-01-20 ENCOUNTER — Other Ambulatory Visit: Payer: Self-pay | Admitting: Cardiology

## 2024-01-20 DIAGNOSIS — R931 Abnormal findings on diagnostic imaging of heart and coronary circulation: Secondary | ICD-10-CM
